# Patient Record
Sex: Female | Born: 2000 | Race: White | Hispanic: No | Marital: Married | Smoking: Current every day smoker
Health system: Southern US, Community
[De-identification: ages and names within clinical notes are randomized; demographics above are authoritative.]

## PROBLEM LIST (undated history)

## (undated) DIAGNOSIS — A048 Other specified bacterial intestinal infections: Secondary | ICD-10-CM

## (undated) DIAGNOSIS — G8929 Other chronic pain: Secondary | ICD-10-CM

## (undated) DIAGNOSIS — R5383 Other fatigue: Secondary | ICD-10-CM

## (undated) DIAGNOSIS — R109 Unspecified abdominal pain: Secondary | ICD-10-CM

## (undated) DIAGNOSIS — R112 Nausea with vomiting, unspecified: Secondary | ICD-10-CM

## (undated) DIAGNOSIS — Z87442 Personal history of urinary calculi: Secondary | ICD-10-CM

---
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
## 2018-01-29 ENCOUNTER — Ambulatory Visit: Payer: Self-pay | Admitting: Gastroenterology

## 2018-09-16 ENCOUNTER — Encounter: Payer: Self-pay | Admitting: Women's Health

## 2019-09-25 ENCOUNTER — Other Ambulatory Visit: Payer: Self-pay

## 2019-09-25 DIAGNOSIS — Z20822 Contact with and (suspected) exposure to covid-19: Secondary | ICD-10-CM

## 2019-09-26 LAB — NOVEL CORONAVIRUS, NAA: SARS-CoV-2, NAA: NOT DETECTED

## 2020-05-20 DIAGNOSIS — T7840XA Allergy, unspecified, initial encounter: Secondary | ICD-10-CM | POA: Insufficient documentation

## 2020-05-20 DIAGNOSIS — R0789 Other chest pain: Secondary | ICD-10-CM | POA: Insufficient documentation

## 2020-05-20 DIAGNOSIS — R0602 Shortness of breath: Secondary | ICD-10-CM | POA: Insufficient documentation

## 2020-05-20 DIAGNOSIS — R21 Rash and other nonspecific skin eruption: Secondary | ICD-10-CM | POA: Diagnosis not present

## 2020-05-20 DIAGNOSIS — L299 Pruritus, unspecified: Secondary | ICD-10-CM | POA: Insufficient documentation

## 2020-05-20 DIAGNOSIS — R0989 Other specified symptoms and signs involving the circulatory and respiratory systems: Secondary | ICD-10-CM | POA: Diagnosis present

## 2020-05-21 ENCOUNTER — Encounter (HOSPITAL_COMMUNITY): Payer: Self-pay

## 2020-05-21 ENCOUNTER — Other Ambulatory Visit: Payer: Self-pay

## 2020-05-21 ENCOUNTER — Emergency Department (HOSPITAL_COMMUNITY)
Admission: EM | Admit: 2020-05-21 | Discharge: 2020-05-21 | Disposition: A | Discharge status: Home / Self Care | Payer: Medicaid Other | Attending: Emergency Medicine | Admitting: Emergency Medicine

## 2020-05-21 DIAGNOSIS — T7840XA Allergy, unspecified, initial encounter: Secondary | ICD-10-CM

## 2020-05-21 LAB — TROPONIN I (HIGH SENSITIVITY)
Troponin I (High Sensitivity): <2 ng/L (ref <18)
Troponin I (High Sensitivity): <2 ng/L (ref <18)

## 2020-05-21 LAB — CBC WITH DIFFERENTIAL/PLATELET
Abs Immature Granulocytes: 0.02 10*3/uL (ref 0.00–0.07)
Basophils Absolute: 0 10*3/uL (ref 0.0–0.1)
Basophils Relative: 0 %
Eosinophils Absolute: 0.3 10*3/uL (ref 0.0–0.5)
Eosinophils Relative: 3 %
HCT: 43.7 % (ref 36.0–46.0)
Hemoglobin: 13.7 g/dL (ref 12.0–15.0)
Immature Granulocytes: 0 %
Lymphocytes Relative: 47 %
Lymphs Abs: 4.6 10*3/uL — ABNORMAL HIGH (ref 0.7–4.0)
MCH: 26.6 pg (ref 26.0–34.0)
MCHC: 31.4 g/dL (ref 30.0–36.0)
MCV: 84.9 fL (ref 80.0–100.0)
Monocytes Absolute: 0.6 10*3/uL (ref 0.1–1.0)
Monocytes Relative: 7 %
Neutro Abs: 4.1 10*3/uL (ref 1.7–7.7)
Neutrophils Relative %: 43 %
Platelets: 481 10*3/uL — ABNORMAL HIGH (ref 150–400)
RBC: 5.15 MIL/uL — ABNORMAL HIGH (ref 3.87–5.11)
RDW: 13.8 % (ref 11.5–15.5)
WBC: 9.7 10*3/uL (ref 4.0–10.5)
nRBC: 0 % (ref 0.0–0.2)

## 2020-05-21 LAB — BASIC METABOLIC PANEL
Anion gap: 11 (ref 5–15)
BUN: 13 mg/dL (ref 6–20)
CO2: 23 mmol/L (ref 22–32)
Calcium: 8.9 mg/dL (ref 8.9–10.3)
Chloride: 103 mmol/L (ref 98–111)
Creatinine, Ser: 0.89 mg/dL (ref 0.44–1.00)
GFR calc Af Amer: >60 mL/min (ref >60)
GFR calc non Af Amer: >60 mL/min (ref >60)
Glucose, Bld: 95 mg/dL (ref 70–99)
Potassium: 3.5 mmol/L (ref 3.5–5.1)
Sodium: 137 mmol/L (ref 135–145)

## 2020-05-21 MED ORDER — FAMOTIDINE IN NACL 20-0.9 MG/50ML-% IV SOLN
20.0000 mg | Freq: Once | INTRAVENOUS | Status: AC
  Administered 2020-05-21: 20 mg via INTRAVENOUS
  Filled 2020-05-21: qty 50

## 2020-05-21 MED ORDER — EPINEPHRINE 0.3 MG/0.3ML IJ SOAJ
0.3000 mg | Freq: Once | INTRAMUSCULAR | Status: AC
  Administered 2020-05-21: 0.3 mg via INTRAMUSCULAR
  Filled 2020-05-21: qty 0.3

## 2020-05-21 MED ORDER — PREDNISONE 50 MG PO TABS
ORAL_TABLET | ORAL | 0 refills | Status: DC
Start: 2020-05-21 — End: 2022-03-28

## 2020-05-21 MED ORDER — EPINEPHRINE 0.3 MG/0.3ML IJ SOAJ: 0.3000 mg | INTRAMUSCULAR | 0 refills | Status: AC | PRN

## 2020-05-21 MED ORDER — DIPHENHYDRAMINE HCL 50 MG/ML IJ SOLN
25.0000 mg | Freq: Once | INTRAMUSCULAR | Status: AC
  Administered 2020-05-21: 25 mg via INTRAVENOUS
  Filled 2020-05-21: qty 1

## 2020-05-21 MED ORDER — METHYLPREDNISOLONE SODIUM SUCC 125 MG IJ SOLR
125.0000 mg | Freq: Once | INTRAMUSCULAR | Status: AC
  Administered 2020-05-21: 125 mg via INTRAVENOUS
  Filled 2020-05-21: qty 2

## 2020-05-21 NOTE — ED Triage Notes (Signed)
Pt reports onset of itching, light whelts approx 30 mins ago.  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 Pt also reports mild thick feeling in her throat, no obvious oral swelling.

## 2020-05-21 NOTE — Discharge Instructions (Addendum)
Take the steroids and anithistamines as prescribed. Use the epipen as need for severe reaction with difficulty breathing, tongue or lip swelling, chest pain, shortness of breath or any other concerns. If you use the epipen, you must come to the hospital. Return to the ED if you develop new or worsening symptoms.

## 2020-05-21 NOTE — ED Provider Notes (Signed)
[facility] EMERGENCY DEPARTMENT Provider Note   CSN: [id number] Arrival date & time: 05/20/20  2358     History Chief Complaint  Patient presents with  . Allergic Reaction    Brenda Gilbert is a 19 y.o. female.  Patient here with suspected allergic reaction to unknown allergen.  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  She complains of throat tightness, itching all over and shortness of breath and chest tightness.  Took 2 Benadryl at home without relief.  Does not have an EpiPen.  Feels like in the back of her throat and tight in her chest with shortness of breath.  Denies any new exposures to foods, medications, lotions or clothing.  No seafood intake tonight.  No abdominal pain, nausea or vomiting no leg pain or leg swelling.  The history is provided by the patient.  Allergic Reaction Presenting symptoms: rash        Past Medical History:  Diagnosis Date  . Chronic abdominal pain   . Fatigue   . Helicobacter pylori (H. pylori) infection   . Nausea and vomiting    recurrent    There are no problems to display for this patient.   History reviewed. No pertinent surgical history.   OB History    Gravida      Para      Term      Preterm      AB      Living  0     SAB      TAB      Ectopic      Multiple      Live Births              Family History  Problem Relation Age of Onset  . Hypertension Mother   . Diabetes Father   . Hypertension Father     Social History   Tobacco Use  . Smoking status: Never Smoker  . Smokeless tobacco: Never Used  Substance Use Topics  . Alcohol use: No  . Drug use: No    Home Medications Prior to Admission medications   Medication Sig Start Date End Date Taking? Authorizing Provider  ondansetron (ZOFRAN ODT) 8 MG disintegrating tablet Take 1 tablet (8 mg total) by mouth every 8 (eight) hours as needed for nausea or vomiting. 11/12/16   Jola Schmidt, MD    Allergies     Fish-derived products  Review of Systems   Review of Systems  Constitutional: Negative for activity change, appetite change and fever.  HENT: Positive for sore throat. Negative for congestion and rhinorrhea.   Respiratory: Positive for chest tightness. Negative for shortness of breath.   Cardiovascular: Negative for chest pain.  Gastrointestinal: Negative for nausea and vomiting.  Genitourinary: Negative for dysuria and hematuria.  Musculoskeletal: Negative for arthralgias and myalgias.  Skin: Positive for rash.  Neurological: Negative for dizziness, weakness and headaches.   all other systems are negative except as noted in the HPI and PMH.    Physical Exam Updated Vital Signs BP (!) 133/94 (BP Location: Right Arm)   Pulse 98   Temp (!) 97.4 F (36.3 C) (Oral)   Resp 20   Ht 5\' 3"  (1.6 m)   Wt 78.9 kg   LMP 05/01/2020 Comment: had a baby April 4th  SpO2 100%   BMI 30.82 kg/m   Physical Exam Vitals and nursing note reviewed.  Constitutional:      General: She is  not in acute distress.    Appearance: She is well-developed. She is obese.  HENT:     Head: Normocephalic and atraumatic.     Mouth/Throat:     Pharynx: No oropharyngeal exudate.     Comments: Uvula appears bifurcated, baseline per patient. Uvular edema.  There is no swelling or asymmetry.  No tongue or lip swelling. Controlling secretions. Eyes:     Conjunctiva/sclera: Conjunctivae normal.     Pupils: Pupils are equal, round, and reactive to light.  Neck:     Comments: No meningismus. Cardiovascular:     Rate and Rhythm: Normal rate and regular rhythm.     Heart sounds: Normal heart sounds. No murmur.  Pulmonary:     Effort: Pulmonary effort is normal. No respiratory distress.     Breath sounds: Normal breath sounds. No wheezing.  Abdominal:     Palpations: Abdomen is soft.     Tenderness: There is no abdominal tenderness. There is no guarding or rebound.  Musculoskeletal:        General: No  tenderness. Normal range of motion.     Cervical back: Normal range of motion and neck supple.  Skin:    General: Skin is warm.     Findings: Rash present.     Comments: Scattered urticaria to arms and legs  Neurological:     Mental Status: She is alert and oriented to person, place, and time.     Cranial Nerves: No cranial nerve deficit.     Motor: No abnormal muscle tone.     Coordination: Coordination normal.     Comments:  5/5 strength throughout. CN 2-12 intact.Equal grip strength.   Psychiatric:        Behavior: Behavior normal.     ED Results / Procedures / Treatments   Labs (all labs ordered are listed, but only abnormal results are displayed) Labs Reviewed  CBC WITH DIFFERENTIAL/PLATELET - Abnormal; Notable for the following components:      Result Value   RBC 5.15 (*)    Platelets 481 (*)    Lymphs Abs 4.6 (*)    All other components within normal limits  BASIC METABOLIC PANEL  TROPONIN I (HIGH SENSITIVITY)  TROPONIN I (HIGH SENSITIVITY)    EKG EKG Interpretation  Date/Time:  Friday May 21 2020 00:44:49 EDT Ventricular Rate:  76 PR Interval:    QRS Duration: 94 QT Interval:  377 QTC Calculation: 424 R Axis:   91 Text Interpretation: Sinus rhythm Borderline right axis deviation Low voltage, precordial leads No significant change was found Confirmed by Glynn Octave [id number]) on 05/21/2020 1:16:27 AM   Radiology No results found.  Procedures .Critical Care Performed by: Glynn Octave, MD Authorized by: Glynn Octave, MD   Critical care provider statement:    Critical care time (minutes):  45   Critical care was necessary to treat or prevent imminent or life-threatening deterioration of the following conditions: anaphylaxis.   Critical care was time spent personally by me on the following activities:  Discussions with consultants, evaluation of patient's response to treatment, examination of patient, ordering and performing treatments and  interventions, ordering and review of laboratory studies, ordering and review of radiographic studies, pulse oximetry, re-evaluation of patient's condition, obtaining history from patient or surrogate and review of old charts   (including critical care time)  Medications Ordered in ED Medications  EPINEPHrine (EPI-PEN) injection 0.3 mg (has no administration in time range)  methylPREDNISolone sodium succinate (SOLU-MEDROL) 125 mg/2 mL injection 125 mg (  has no administration in time range)  famotidine (PEPCID) IVPB 20 mg premix (has no administration in time range)  diphenhydrAMINE (BENADRYL) injection 25 mg (has no administration in time range)    ED Course  I have reviewed the triage vital signs and the nursing notes.  Pertinent labs & imaging results that were available during my care of the patient were reviewed by me and considered in my medical decision making (see chart for details).    MDM Rules/Calculators/A&P                     Allergic reaction to unknown allergen.  No tongue or lip swelling.  Does complain of chest tightness and throat tightness and shortness of breath.  She is given IM epinephrine, steroids and Benadryl.  Patient improved with IM epinephrine, steroids and antihistamines.  Observed in the ED for 3 hours after epinephrine injection without deterioration.  She feels back to baseline with no chest pain, shortness of breath, tongue or lip swelling.  Feels normal.  Her rash has resolved.  Labs reassuring.  Troponin negative.  Low suspicion for ACS or PE.  EKG sinus rhythm.  Unclear etiology of her reaction.  Will give EpiPen for home.  She is not breast-feeding.  Will give short course of steroids and antihistamines.  Indications for EpiPen discussed with patient and her significant other.  Return precautions discussed including chest pain, difficulty breathing, tongue or lip swelling, any other concerns. Final Clinical Impression(s) / ED Diagnoses Final  diagnoses:  Allergic reaction, initial encounter    Rx / DC Orders ED Discharge Orders    None       Agnieszka Newhouse, Jeannett Senior, MD 05/21/20 [phone]

## 2020-08-16 ENCOUNTER — Ambulatory Visit
Admission: EM | Admit: 2020-08-16 | Discharge: 2020-08-16 | Disposition: A | Discharge status: Home / Self Care | Payer: Medicaid Other | Attending: Emergency Medicine | Admitting: Emergency Medicine

## 2020-08-16 ENCOUNTER — Encounter (HOSPITAL_COMMUNITY): Payer: Self-pay

## 2020-08-16 ENCOUNTER — Emergency Department (HOSPITAL_COMMUNITY)
Admission: EM | Admit: 2020-08-16 | Discharge: 2020-08-16 | Disposition: A | Discharge status: Home / Self Care | Payer: Medicaid Other | Attending: Emergency Medicine | Admitting: Emergency Medicine

## 2020-08-16 ENCOUNTER — Other Ambulatory Visit: Payer: Self-pay

## 2020-08-16 DIAGNOSIS — N390 Urinary tract infection, site not specified: Secondary | ICD-10-CM | POA: Diagnosis not present

## 2020-08-16 DIAGNOSIS — R350 Frequency of micturition: Secondary | ICD-10-CM

## 2020-08-16 DIAGNOSIS — Z5321 Procedure and treatment not carried out due to patient leaving prior to being seen by health care provider: Secondary | ICD-10-CM | POA: Insufficient documentation

## 2020-08-16 DIAGNOSIS — R109 Unspecified abdominal pain: Secondary | ICD-10-CM | POA: Diagnosis present

## 2020-08-16 DIAGNOSIS — R319 Hematuria, unspecified: Secondary | ICD-10-CM | POA: Diagnosis not present

## 2020-08-16 LAB — COMPREHENSIVE METABOLIC PANEL WITH GFR
ALT: 21 U/L (ref 0–44)
AST: 15 U/L (ref 15–41)
Albumin: 4 g/dL (ref 3.5–5.0)
Alkaline Phosphatase: 54 U/L (ref 38–126)
Anion gap: 8 (ref 5–15)
BUN: 11 mg/dL (ref 6–20)
CO2: 23 mmol/L (ref 22–32)
Calcium: 9.1 mg/dL (ref 8.9–10.3)
Chloride: 105 mmol/L (ref 98–111)
Creatinine, Ser: 0.83 mg/dL (ref 0.44–1.00)
GFR calc Af Amer: >60 mL/min
GFR calc non Af Amer: >60 mL/min
Glucose, Bld: 112 mg/dL — ABNORMAL HIGH (ref 70–99)
Potassium: 3.7 mmol/L (ref 3.5–5.1)
Sodium: 136 mmol/L (ref 135–145)
Total Bilirubin: 0.4 mg/dL (ref 0.3–1.2)
Total Protein: 7.5 g/dL (ref 6.5–8.1)

## 2020-08-16 LAB — CBC
HCT: 40.3 % (ref 36.0–46.0)
Hemoglobin: 13.2 g/dL (ref 12.0–15.0)
MCH: 27.3 pg (ref 26.0–34.0)
MCHC: 32.8 g/dL (ref 30.0–36.0)
MCV: 83.3 fL (ref 80.0–100.0)
Platelets: 434 10*3/uL — ABNORMAL HIGH (ref 150–400)
RBC: 4.84 MIL/uL (ref 3.87–5.11)
RDW: 13 % (ref 11.5–15.5)
WBC: 10.7 10*3/uL — ABNORMAL HIGH (ref 4.0–10.5)
nRBC: 0 % (ref 0.0–0.2)

## 2020-08-16 LAB — POCT URINALYSIS DIP (MANUAL ENTRY)
Bilirubin, UA: NEGATIVE
Glucose, UA: NEGATIVE mg/dL
Nitrite, UA: NEGATIVE
Spec Grav, UA: 1.025
Urobilinogen, UA: 1 U/dL
pH, UA: 6.5

## 2020-08-16 LAB — LIPASE, BLOOD: Lipase: 31 U/L (ref 11–51)

## 2020-08-16 LAB — POCT URINE PREGNANCY: Preg Test, Ur: NEGATIVE

## 2020-08-16 NOTE — ED Triage Notes (Signed)
Pt to er, pt states that she just got a puppy and they puppy had worms, states that since then she has been having some abd pain.  States that she started having some blood in her urine, states that she thought that she might have a uti, states that then she had a bm and there were worms in her stool.  Denies  Blood in her stool

## 2020-08-16 NOTE — Discharge Instructions (Addendum)
Urine culture sent.  We will call you with the results.   Push fluids and get plenty of rest.   Follow up with PCP to have GI lab completed Return here or go to ER if you have any new or worsening symptoms such as fever, worsening abdominal pain, nausea/vomiting, flank pain, etc..Marland Kitchen

## 2020-08-16 NOTE — ED Provider Notes (Addendum)
[facility] ([facility])  Chief Complaint  Patient presents with  . Urinary Tract Infection      SUBJECTIVE:  Brenda Gilbert is a 19 y.o. female who complains of frequency, mucoid vaginal  discharge that occurred today.  Patient denies a precipitating event, recent sexual encounter, excessive caffeine intake.  Denies abdominal or flank pain.  Has tried OTC medications without relief.  Symptoms are made worse with urination.  Admits to similar symptoms in the past.  Denies fever, chills, nausea, vomiting, abdominal pain, flank pain, abnormal vaginal discharge or bleeding, hematuria.    She is also complaining of intestinal worms.  Report a bowel movement with the large worm day.  Has a dog with her worm as well.  Has not  use any medication.  Denies similar symptoms in the past.  LMP: No LMP recorded.  ROS: As in HPI.  All other pertinent ROS negative.     Past Medical History:  Diagnosis Date  . Chronic abdominal pain   . Fatigue   . Helicobacter pylori (H. pylori) infection   . Nausea and vomiting    recurrent   Past Surgical History:  Procedure Laterality Date  . CESAREAN SECTION     Allergies  Allergen Reactions  . Fish-Derived Products Anaphylaxis    seafood   No current facility-administered medications on file prior to encounter.   Current Outpatient Medications on File Prior to Encounter  Medication Sig Dispense Refill  . EPINEPHrine (EPIPEN 2-PAK) 0.3 mg/0.3 mL IJ SOAJ injection Inject 0.3 mLs (0.3 mg total) into the muscle as needed for anaphylaxis (for severe allergic reaction with difficulty breathing, tongue or lip swelling, chest pain or shortness of breath). 1 each 0  . ondansetron (ZOFRAN ODT) 8 MG disintegrating tablet Take 1 tablet (8 mg total) by mouth every 8 (eight) hours as needed for nausea or vomiting. 10 tablet 0  . predniSONE (DELTASONE) 50 MG tablet 1 tablet PO daily 5 tablet 0   Social History   Socioeconomic History  . Marital status: Single     Spouse name: Not on file  . Number of children: Not on file  . Years of education: Not on file  . Highest education level: Not on file  Occupational History  . Not on file  Tobacco Use  . Smoking status: Never Smoker  . Smokeless tobacco: Never Used  Substance and Sexual Activity  . Alcohol use: No  . Drug use: No  . Sexual activity: Never  Other Topics Concern  . Not on file  Social History Narrative  . Not on file   Social Determinants of Health   Financial Resource Strain:   . Difficulty of Paying Living Expenses: Not on file  Food Insecurity:   . Worried About Programme researcher, broadcasting/film/video in the Last Year: Not on file  . Ran Out of Food in the Last Year: Not on file  Transportation Needs:   . Lack of Transportation (Medical): Not on file  . Lack of Transportation (Non-Medical): Not on file  Physical Activity:   . Days of Exercise per Week: Not on file  . Minutes of Exercise per Session: Not on file  Stress:   . Feeling of Stress : Not on file  Social Connections:   . Frequency of Communication with Friends and Family: Not on file  . Frequency of Social Gatherings with Friends and Family: Not on file  . Attends Religious Services: Not on file  . Active Member of Clubs or Organizations:  Not on file  . Attends Banker Meetings: Not on file  . Marital Status: Not on file  Intimate Partner Violence:   . Fear of Current or Ex-Partner: Not on file  . Emotionally Abused: Not on file  . Physically Abused: Not on file  . Sexually Abused: Not on file   Family History  Problem Relation Age of Onset  . Hypertension Mother   . Diabetes Father   . Hypertension Father     OBJECTIVE:  Vitals:   08/16/20 1710  BP: 127/84  Pulse: 90  Resp: 16  Temp: 98.9 F (37.2 C)  TempSrc: Oral  SpO2: 97%   General appearance: AOx3 in no acute distress HEENT: NCAT.  Oropharynx clear.  Lungs: clear to auscultation bilaterally without adventitious breath sounds Heart: regular  rate and rhythm.  Radial pulses 2+ symmetrical bilaterally Abdomen: soft; non-distended; no tenderness; bowel sounds present; no guarding or rebound tenderness Back: no CVA tenderness Extremities: no edema; symmetrical with no gross deformities Skin: warm and dry Neurologic: Ambulates from chair to exam table without difficulty Psychological: alert and cooperative; normal mood and affect  Labs Reviewed  POCT URINALYSIS DIP (MANUAL ENTRY) - Abnormal; Notable for the following components:      Result Value   Ketones, POC UA trace (5) (*)    Blood, UA small (*)    Protein Ur, POC trace (*)    Leukocytes, UA Trace (*)    All other components within normal limits  URINE CULTURE  POCT URINE PREGNANCY    ASSESSMENT & PLAN:  1. Urine frequency     No orders of the defined types were placed in this encounter.  Discharge instructions Urine culture sent.  We will call you with the results.   Push fluids and get plenty of rest.   Follow up with PCP to have a GI lab completed Return here or go to ER if you have any new or worsening symptoms such as fever, worsening abdominal pain, nausea/vomiting, flank pain, etc...  Outlined signs and symptoms indicating need for more acute intervention. Patient verbalized understanding. After Visit Summary given.   Note: This document was prepared using Dragon voice recognition software and may include unintentional dictation errors.    Durward Parcel, FNP 08/16/20 1758    Durward Parcel, FNP 08/16/20 1758

## 2020-08-16 NOTE — ED Triage Notes (Signed)
See provider note. 

## 2020-08-18 LAB — URINE CULTURE: Culture: 50000 — AB

## 2020-08-19 ENCOUNTER — Telehealth (HOSPITAL_COMMUNITY): Payer: Self-pay | Admitting: Emergency Medicine

## 2020-08-19 MED ORDER — CEPHALEXIN 500 MG PO CAPS
500.0000 mg | ORAL_CAPSULE | Freq: Two times a day (BID) | ORAL | 0 refills | Status: AC
Start: 2020-08-19 — End: 2020-08-26

## 2020-08-19 NOTE — Telephone Encounter (Signed)
Positive urine culture.  No abx prescribed at the time of the visit.  Keflex 500mg  BID x 7 days sent to pharmacy of request.  Patient informed.

## 2021-03-23 ENCOUNTER — Inpatient Hospital Stay (HOSPITAL_COMMUNITY)
Admission: AD | Admit: 2021-03-23 | Discharge: 2021-03-23 | Disposition: A | Discharge status: Home / Self Care | Payer: Medicaid Other | Attending: Obstetrics and Gynecology | Admitting: Obstetrics and Gynecology

## 2021-03-23 ENCOUNTER — Other Ambulatory Visit: Payer: Self-pay

## 2021-03-23 DIAGNOSIS — N939 Abnormal uterine and vaginal bleeding, unspecified: Secondary | ICD-10-CM | POA: Insufficient documentation

## 2021-03-23 DIAGNOSIS — N926 Irregular menstruation, unspecified: Secondary | ICD-10-CM

## 2021-03-23 LAB — POCT PREGNANCY, URINE: Preg Test, Ur: NEGATIVE

## 2021-03-23 MED ORDER — IBUPROFEN 600 MG PO TABS: 600.0000 mg | ORAL_TABLET | Freq: Four times a day (QID) | ORAL | 1 refills | Status: DC | PRN

## 2021-03-23 NOTE — MAU Note (Signed)
Wynelle Bourgeois CNM in [place] [facility] to see pt and discuss plan of care.

## 2021-03-23 NOTE — MAU Note (Signed)
My period started this am and my bleeding has been very heavy all day. Having a lot of cramping in my back and abdomen. Cannot get into my ob/gyn until Tues but was told to come here if pain got worse. Has not taken anything for pain

## 2021-03-23 NOTE — Discharge Instructions (Signed)
Menstruation  Menstruation, also known as a menstrual period, is the monthly shedding of the lining of the uterus. The lining of the uterus is made up of blood, tissue, fluid, and mucus. The uterus is the organ in the lower abdomen where a baby grows during pregnancy. The flow of blood usually occurs during 3-7 consecutive days each month. Girls usually start their periods between the ages of 12 and 14, but some girls may be older or younger when they start their periods. Women continue to have periods until they reach menopause. Menopause usually occurs between the ages of 48 and 55. A period is part of a woman's menstrual cycle, which is a series of changes that the body goes through to prepare for pregnancy. Usually, you will get your period about every 28 days if you do not get pregnant. However, some women get their periods as soon as every 21 days or as late as every 45 days. Hormones control the menstrual cycle. Hormones are chemicals that the body produces to regulate different body functions. These hormones trigger changes in your uterus. Every month, the lining of your uterus gets thicker to prepare for pregnancy. And every month that you do not get pregnant, your uterus gets rid of its thick lining and cleans itself out. This is your period. What can I expect during my period? Periods are different for each woman and girl. You may experience:  Bleeding that lasts for 3-7 days. A little more or less bleeding is normal.  Occasional heavy bleeding.  Cramps in the lower abdomen.  Aching or pain in the lower back area.  Sore breasts.  Dizziness.  Nausea or diarrhea. Other symptoms may occur 1 to 2 weeks before your menstrual period starts and go away a few days after menstrual bleeding begins. These symptoms are referred to as premenstrual syndrome (PMS). These symptoms can include:  Headache.  Breast tenderness and swelling.  Bloating.  Tiredness (fatigue).  Mood  changes.  Craving for certain foods.  Trouble concentrating. Supplies needed:  Tampons, sanitary pads, or menstrual cups.  Over-the-counter pain reliever as told by your health care provider.  Heating pad or wrap. How to care for yourself during your period You can capture the flow of menstrual blood using:  Tampons. These are pieces of cotton that are usually packed into plastic or cardboard applicators. The cotton has a string attached to it. You insert the cotton inside your vagina to absorb your menstrual blood and pull the string to remove it. You must change your tampon at least every 4-8 hours.  Sanitary pads. These are thick pads with a sticky back that you attach to your underwear to absorb menstrual blood. You must change your pad at least every 4-8 hours, or whenever you are uncomfortable.  Menstrual cups. These are small rubber cups that you place inside of your vagina. You must remove and empty a menstrual cup at least every 8-12 hours. To help relieve pain and discomfort during your period:  Take an over-the-counter pain reliever as told by your health care provider.  Use a heating pad or heat wrap on your abdomen to ease cramping.  Exercise regularly.  Eat a healthy diet. A healthy diet includes a lot of fruits and vegetables, low-fat dairy products, lean meats, and foods that contain fiber.  Avoid foods and drinks that may make your symptoms worse before or during your period. This includes foods that contain: ? Caffeine. ? Salt. ? Sugar.   How do   I know if my period is not normal? Periods are different for everyone. Your period may last for a longer or shorter time than usual, and bleeding may be light or heavy. Signs that your period may not be normal include:  Bleeding very heavily, such as soaking through a tampon or pad in 1-2 hours.  Bleeding more than 7 days.  Bleeding after you have sex.  Cramps that are so painful you cannot do your daily  activities.  Cramps that get much worse than they used to be.  Bleeding between periods.  Missing your period for longer than 3 months.  Your menstrual cycle becoming irregular, when it used to be regular. Follow these instructions at home:  Keep track of your periods by using a calendar.  If you use tampons, use the least absorbent possible to avoid complications such as toxic shock syndrome.  Do not leave tampons in your vagina overnight or longer than 8 hours.  Wear a sanitary pad overnight. Contact a health care provider if:  You have signs that your period may not be normal.  You develop a fever with your period.  Your periods last more than 7 days.  You develop clots with your period and never had clots before.  You cannot get relief for your symptoms from over-the-counter medicine. Get help right away if:  Your period is so heavy that you have to change pads or tampons every 30 minutes.  You have any symptoms of toxic shock syndrome (TSS), such as: ? A high fever. ? Vomiting or diarrhea. ? Red skin that looks like a sunburn. ? Red eyes. ? Fainting or feeling dizzy. ? Sore throat. ? Muscle aches. If you develop any of these symptoms, visit your health care provider immediately. TSS is a serious health condition that can be caused by wearing a tampon for too long. Summary  Menstruation, also known as a menstrual period, is the monthly shedding of the lining of the uterus.  During your period, you pass blood, tissue, fluid, and mucus out of your vagina.  Keep track of your periods by using a calendar.  Contact a health care provider if you have signs that your period may not be normal. This information is not intended to replace advice given to you by your health care provider. Make sure you discuss any questions you have with your health care provider. Document Revised: 07/28/2020 Document Reviewed: 07/28/2020 Elsevier Patient Education  2021 Elsevier  Inc.  

## 2021-03-23 NOTE — MAU Provider Note (Signed)
MSE done at 2245   S Ms. Brenda Gilbert is a 20 y.o. . patient who presents to MAU today with complaint of heavy bleeding and cramping with menses.  Started today. Has not taken anything for it.  Has an appt in [place] on Tuesday for evaluation .  RN Note: My period started this am and my bleeding has been very heavy all day. Having a lot of cramping in my back and abdomen. Cannot get into my ob/gyn until Tues but was told to come here if pain got worse. Has not taken anything for pain  O BP 127/82 (BP Location: Right Arm)   Pulse (!) 111   Temp 98.5 F (36.9 C)   Resp 18   Ht 5\' 3"  (1.6 m)   Wt 78.5 kg   LMP 03/23/2021   SpO2 100%   BMI 30.65 kg/m  Physical Exam Constitutional:      General: She is not in acute distress.    Appearance: She is well-developed. She is not ill-appearing or toxic-appearing.  Cardiovascular:     Rate and Rhythm: Normal rate.  Pulmonary:     Effort: Pulmonary effort is normal.  Skin:    General: Skin is dry.  Neurological:     Mental Status: She is alert.     A Medical screening exam complete Menstrual complications  P Discharge from MAU in stable condition Patient given the option of transfer to [facility] for further evaluation or seek care in outpatient facility of choice (reviewed [facility] location if wants [facility] group)  Warning signs for worsening condition that would warrant emergency follow-up discussed Patient may return to MAU as needed if becomes pregnant  [facility], [facility] Signs 03/24/2021 5:24 AM

## 2022-03-16 LAB — TSH: TSH: 0.36 — AB (ref 0.41–5.90)

## 2022-03-16 LAB — BASIC METABOLIC PANEL
BUN: 10 (ref 4–21)
Creatinine: 0.8 (ref 0.5–1.1)

## 2022-03-16 LAB — COMPREHENSIVE METABOLIC PANEL
Calcium: 10 (ref 8.7–10.7)
eGFR: 107

## 2022-03-16 LAB — LIPID PANEL
LDL Cholesterol: 148
Triglycerides: 94 (ref 40–160)

## 2022-03-20 ENCOUNTER — Encounter: Payer: Self-pay | Admitting: Internal Medicine

## 2022-03-28 ENCOUNTER — Encounter: Payer: Self-pay | Admitting: Nurse Practitioner

## 2022-03-28 ENCOUNTER — Ambulatory Visit (INDEPENDENT_AMBULATORY_CARE_PROVIDER_SITE_OTHER): Payer: Medicaid Other | Admitting: Nurse Practitioner

## 2022-03-28 VITALS — BP 104/68 | HR 83 | Ht 63.1 in | Wt 200.4 lb

## 2022-03-28 DIAGNOSIS — R7989 Other specified abnormal findings of blood chemistry: Secondary | ICD-10-CM | POA: Diagnosis not present

## 2022-03-28 DIAGNOSIS — E0590 Thyrotoxicosis, unspecified without thyrotoxic crisis or storm: Secondary | ICD-10-CM | POA: Diagnosis not present

## 2022-03-28 NOTE — Progress Notes (Signed)
? ? ? 03/28/2022   ? ? ?Endocrinology Consult Note  ? ? ?Subjective:  ? ? Patient ID: Brenda SorensonLaycie Gilbert, female    DOB: 06/21/2001, PCP Brenda NevinsFusco, Lawrence, MD. ? ? ?Past Medical History:  ?Diagnosis Date  ? Chronic abdominal pain   ? Fatigue   ? Helicobacter pylori (H. pylori) infection   ? Nausea and vomiting   ? recurrent  ? ? ?Past Surgical History:  ?Procedure Laterality Date  ? CESAREAN SECTION    ? 03/28/2020 and 12/30/2021  ? ? ?Social History  ? ?Socioeconomic History  ? Marital status: Married  ?  Spouse name: Not on file  ? Number of children: Not on file  ? Years of education: Not on file  ? Highest education level: Not on file  ?Occupational History  ? Not on file  ?Tobacco Use  ? Smoking status: Every Day  ?  Types: Cigarettes  ? Smokeless tobacco: Never  ?Vaping Use  ? Vaping Use: Every day  ?Substance and Sexual Activity  ? Alcohol use: No  ? Drug use: No  ? Sexual activity: Yes  ?Other Topics Concern  ? Not on file  ?Social History Narrative  ? Not on file  ? ?Social Determinants of Health  ? ?Financial Resource Strain: Not on file  ?Food Insecurity: Not on file  ?Transportation Needs: Not on file  ?Physical Activity: Not on file  ?Stress: Not on file  ?Social Connections: Not on file  ? ? ?Family History  ?Problem Relation Age of Onset  ? Cancer Mother   ? Thyroid disease Mother   ? Hypertension Mother   ? Cancer Father   ? Diabetes Father   ? Hypertension Father   ? ? ?Outpatient Encounter Medications as of 03/28/2022  ?Medication Sig  ? citalopram (CELEXA) 20 MG tablet Take 40 mg by mouth daily.  ? EPINEPHrine (EPIPEN 2-PAK) 0.3 mg/0.3 mL IJ SOAJ injection Inject 0.3 mLs (0.3 mg total) into the muscle as needed for anaphylaxis (for severe allergic reaction with difficulty breathing, tongue or lip swelling, chest pain or shortness of breath).  ? pantoprazole (PROTONIX) 40 MG tablet Take 40 mg by mouth 2 (two) times daily.  ? SPRINTEC 28 0.25-35 MG-MCG tablet Take 1 tablet by mouth daily.  ? ibuprofen  (ADVIL) 600 MG tablet Take 1 tablet (600 mg total) by mouth every 6 (six) hours as needed. (Patient not taking: Reported on 03/28/2022)  ? ondansetron (ZOFRAN ODT) 8 MG disintegrating tablet Take 1 tablet (8 mg total) by mouth every 8 (eight) hours as needed for nausea or vomiting. (Patient not taking: Reported on 03/28/2022)  ? predniSONE (DELTASONE) 50 MG tablet 1 tablet PO daily (Patient not taking: Reported on 03/28/2022)  ? ?No facility-administered encounter medications on file as of 03/28/2022.  ? ? ?ALLERGIES: ?Allergies  ?Allergen Reactions  ? Fish-Derived Products Anaphylaxis  ?  seafood  ? ? ?VACCINATION STATUS: ? ?There is no immunization history on file for this patient. ? ? ?HPI ? ?Brenda Gilbert is 21 y.o. female who presents today with a medical history as above. she is being seen in consultation for hyperthyroidism requested by Brenda NevinsFusco, Lawrence, MD.  she has been dealing with symptoms of  anxiety, insomnia, and cold/hot temperature fluctuations for 2 years approximately. These symptoms are progressively worsening and troubling to her.  her most recent thyroid labs revealed mildly suppressed TSH of 0.363 on 03/16/22.  She says her OBGYN was monitoring her thyroid function during this last pregnancy as TSH  was slightly low but she does not know of any other incidences where her thyroid levels have been abnormal.  ? ?she denies dysphagia, choking, shortness of breath, no recent voice change.  ?  ?she does have extensive family history of thyroid dysfunction in her mother (hypothyroidism), grandmother (hyperactive) and aunts, but denies family hx of thyroid cancer. she denies personal history of goiter. she is not on any anti-thyroid medications nor on any thyroid hormone supplements. She has been on hair skin and nail vitamins in the past but has not been taking them in quite some time.  Denies use of Biotin containing supplements.  she is willing to proceed with appropriate work up and therapy for  thyrotoxicosis. ? ? ?Review of systems ? ?Constitutional: + increasing body weight-recently had her second child, current Body mass index is 35.39 kg/m?., no fatigue, + hot/cold temperature fluctuations, she is 3 months post-partum ?Eyes: no blurry vision, no xerophthalmia ?ENT: no sore throat, no nodules palpated in throat, no dysphagia/odynophagia, no hoarseness ?Cardiovascular: no chest pain, no shortness of breath, no palpitations, no leg swelling ?Respiratory: no cough, no shortness of breath ?Gastrointestinal: no nausea/vomiting/diarrhea ?Musculoskeletal: no muscle/joint aches ?Skin: no rashes, no hyperemia ?Neurological: no tremors, no numbness, no tingling, no dizziness ?Psychiatric: no depression, + anxiety, + insomnia (has history of PTSD) ? ? ?Objective:  ?  ?BP 104/68   Pulse 83   Ht 5' 3.1" (1.603 m)   Wt 200 lb 6.4 oz (90.9 kg)   SpO2 97%   BMI 35.39 kg/m?   ?Wt Readings from Last 3 Encounters:  ?03/28/22 200 lb 6.4 oz (90.9 kg)  ?03/23/21 173 lb (78.5 kg)  ?05/21/20 174 lb (78.9 kg)  ?  ? ?BP Readings from Last 3 Encounters:  ?03/28/22 104/68  ?03/23/21 127/82  ?08/16/20 127/84  ? ? ?                     ? ?Physical Exam- Limited ? ?Constitutional:  Body mass index is 35.39 kg/m?. , not in acute distress, normal state of mind ?Eyes:  EOMI, no exophthalmos ?Neck: Supple ?Thyroid: Mild fullness noted bilaterally- tender to palpation on right side ?Cardiovascular: RRR, no murmurs, rubs, or gallops, no edema ?Respiratory: Adequate breathing efforts, no crackles, rales, rhonchi, or wheezing ?Musculoskeletal: no gross deformities, strength intact in all four extremities, no gross restriction of joint movements ?Skin:  no rashes, no hyperemia ?Neurological: + tremor with outstretched hands, DTR normal in BLE ? ? ?CMP  ?   ?Component Value Date/Time  ? NA 136 08/16/2020 1552  ? K 3.7 08/16/2020 1552  ? CL 105 08/16/2020 1552  ? CO2 23 08/16/2020 1552  ? GLUCOSE 112 (H) 08/16/2020 1552  ? BUN 10  03/16/2022 0000  ? CREATININE 0.8 03/16/2022 0000  ? CREATININE 0.83 08/16/2020 1552  ? CALCIUM 10.0 03/16/2022 0000  ? PROT 7.5 08/16/2020 1552  ? ALBUMIN 4.0 08/16/2020 1552  ? AST 15 08/16/2020 1552  ? ALT 21 08/16/2020 1552  ? ALKPHOS 54 08/16/2020 1552  ? BILITOT 0.4 08/16/2020 1552  ? GFRNONAA >60 08/16/2020 1552  ? GFRAA >60 08/16/2020 1552  ? ? ? ?CBC ?   ?Component Value Date/Time  ? WBC 10.7 (H) 08/16/2020 1552  ? RBC 4.84 08/16/2020 1552  ? HGB 13.2 08/16/2020 1552  ? HCT 40.3 08/16/2020 1552  ? PLT 434 (H) 08/16/2020 1552  ? MCV 83.3 08/16/2020 1552  ? MCH 27.3 08/16/2020 1552  ? MCHC 32.8 08/16/2020 1552  ?  RDW 13.0 08/16/2020 1552  ? LYMPHSABS 4.6 (H) 05/21/2020 0025  ? MONOABS 0.6 05/21/2020 0025  ? EOSABS 0.3 05/21/2020 0025  ? BASOSABS 0.0 05/21/2020 0025  ? ? ? ?Diabetic Labs (most recent): ?No results found for: HGBA1C ? ?Lipid Panel  ?   ?Component Value Date/Time  ? TRIG 94 03/16/2022 0000  ? LDLCALC 148 03/16/2022 0000  ? ? ? ?Lab Results  ?Component Value Date  ? TSH 0.36 (A) 03/16/2022  ? TSH 0.671 06/10/2016  ?  ? ? ? ? ?Assessment & Plan:  ? ?1. Abnormal TSH- ? Hyperthyroidism ? ?she is being seen at a kind request of Brenda Nevins, MD. ? ?her history and most recent labs are reviewed, and she was examined clinically. Subjective and objective findings are inconsistent with thyrotoxicosis from primary hyperthyroidism. The potential risks of untreated thyrotoxicosis and the need for definitive therapy have been discussed in detail with her, and she agrees to proceed with diagnostic workup and treatment plan. ?  ?I will repeat full profile thyroid function tests today, including antibody testing to assess for autoimmune thyroid dysfunction.  Depending on her results, will recommend follow up imaging.  If labs still suggestive of hyperthyroidism, will order uptake and scan, otherwise, will order thyroid ultrasound to assess baseline anatomy given mild goiter.  I will call her with the results  once they return and discuss further imaging. ?  ?Options of therapy are discussed with her if she is confirmed to have truly hyperactive thyroid.  We discussed the option of treating it with medications including methimazole or P

## 2022-03-28 NOTE — Patient Instructions (Signed)
Hyperthyroidism  Hyperthyroidism is when the thyroid gland is too active (overactive). The thyroid gland is a small gland located in the lower front part of the neck, just in front of the windpipe (trachea). This gland makes hormones that help control how the body uses food for energy (metabolism) as well as how the heart and brain function. These hormones also play a role in keeping your bones strong. When the thyroid is overactive, it produces toomuch of a hormone called thyroxine. What are the causes? This condition may be caused by: Graves' disease. This is a disorder in which the body's disease-fighting system (immune system) attacks the thyroid gland. This is the most common cause. Inflammation of the thyroid gland. A tumor in the thyroid gland. Use of certain medicines, including: Prescription thyroid hormone replacement. Herbal supplements that mimic thyroid hormones. Amiodarone therapy. Solid or fluid-filled lumps within your thyroid gland (thyroid nodules). Taking in a large amount of iodine from foods or medicines. What increases the risk? You are more likely to develop this condition if: You are female. You have a family history of thyroid conditions. You smoke tobacco. You use a medicine called lithium. You take medicines that affect the immune system (immunosuppressants). What are the signs or symptoms? Symptoms of this condition include: Nervousness. Inability to tolerate heat. Unexplained weight loss. Diarrhea. Change in the texture of hair or skin. Heart skipping beats or making extra beats. Rapid heart rate. Loss of menstruation. Shaky hands. Fatigue. Restlessness. Sleep problems. Enlarged thyroid gland or a lump in the thyroid (nodule). You may also have symptoms of Graves' disease, which may include: Protruding eyes. Dry eyes. Red or swollen eyes. Problems with vision. How is this diagnosed? This condition may be diagnosed based on: Your symptoms and  medical history. A physical exam. Blood tests. Thyroid ultrasound. This test involves using sound waves to produce images of the thyroid gland. A thyroid scan. A radioactive substance is injected into a vein, and images show how much iodine is present in the thyroid. Radioactive iodine uptake test (RAIU). A small amount of radioactive iodine is given by mouth to see how much iodine the thyroid absorbs after a certain amount of time. How is this treated? Treatment depends on the cause and severity of the condition. Treatment may include: Medicines to reduce the amount of thyroid hormone your body makes. Radioactive iodine treatment (radioiodine therapy). This involves swallowing a small dose of radioactive iodine, in capsule or liquid form, to kill thyroid cells. Surgery to remove part or all of your thyroid gland. You may need to take thyroid hormone replacement medicine for the rest of your life after thyroid surgery. Medicines to help manage your symptoms. Follow these instructions at home:  Take over-the-counter and prescription medicines only as told by your health care provider. Do not use any products that contain nicotine or tobacco, such as cigarettes and e-cigarettes. If you need help quitting, ask your health care provider. Follow any instructions from your health care provider about diet. You may be instructed to limit foods that contain iodine. Keep all follow-up visits as told by your health care provider. This is important. You will need to have blood tests regularly so that your health care provider can monitor your condition. Contact a health care provider if: Your symptoms do not get better with treatment. You have a fever. You are taking thyroid hormone replacement medicine and you: Have symptoms of depression. Feel like you are tired all the time. Gain weight. Get help right   away if: You have chest pain. You have decreased alertness or a change in your awareness. You  have abdominal pain. You feel dizzy. You have a rapid heartbeat. You have an irregular heartbeat. You have difficulty breathing. Summary The thyroid gland is a small gland located in the lower front part of the neck, just in front of the windpipe (trachea). Hyperthyroidism is when the thyroid gland is too active (overactive) and produces too much of a hormone called thyroxine. The most common cause is Graves' disease, a disorder in which your immune system attacks the thyroid gland. Hyperthyroidism can cause various symptoms, such as unexplained weight loss, nervousness, inability to tolerate heat, or changes in your heartbeat. Treatment may include medicine to reduce the amount of thyroid hormone your body makes, radioiodine therapy, surgery, or medicines to manage symptoms. This information is not intended to replace advice given to you by your health care provider. Make sure you discuss any questions you have with your healthcare provider. Document Revised: 08/26/2020 Document Reviewed: 08/26/2020 Elsevier Patient Education  2022 [employer]  

## 2022-03-29 LAB — T4, FREE: Free T4: 1.07 ng/dL (ref 0.82–1.77)

## 2022-03-29 LAB — TSH: TSH: 0.422 u[IU]/mL — ABNORMAL LOW (ref 0.450–4.500)

## 2022-03-29 LAB — T3, FREE: T3, Free: 3.3 pg/mL (ref 2.0–4.4)

## 2022-03-29 LAB — THYROGLOBULIN ANTIBODY: Thyroglobulin Antibody: <1 IU/mL (ref 0.0–0.9)

## 2022-03-29 LAB — THYROID PEROXIDASE ANTIBODY: Thyroperoxidase Ab SerPl-aCnc: <9 IU/mL (ref 0–34)

## 2022-04-10 NOTE — Progress Notes (Signed)
? ? ?GI Office Note   ? ?Referring Provider: Elfredia Nevins, MD ?Primary Care Physician:  Elfredia Nevins, MD  ? ? ?Chief Complaint  ? ?Chief Complaint  ?Patient presents with  ? Abdominal Pain  ?  Left side upper pain   ? ? ? ?History of Present Illness  ? ?Brenda Gilbert is a 21 y.o. female with a history of depression, gastritis and H. Pylori s/p eradication confirmed by EGD in 2019 presenting today at the request of Elfredia Nevins, MD for evaluation of abdominal pain and dysphagia. ? ? ?Dysphagia ?Patient complains of feeling like food is getting stuck. The dysphagia occurs with solids Symptoms have been present for approximately 2 months. The symptoms are unchanged. The dysphagia has been intermittent and has not been progressive.  She  has a history of gastritis .  She denies melena, hematochezia, hematemesis, and coffee ground emesis.  This has been associated with dysphagia, early satiety, fullness after meals, upper abdominal discomfort, and frequent hiccups .  She denies belching, bilious reflux, chest pain, choking on food, cough, heartburn, need to clear throat frequently, nocturnal burning, odynophagia, regurgitation of undigested food, shortness of breath, symptoms primarily relate to meals, and lying down after meals, and wheezing. ?History of H. Pylori - took triple therapy. Last EGD 04/30/18 - pathology with gastritis, negative for H. Pylori, report not available in care everywhere. ? ?Abdominal Pain ?Patient complains of abdominal pain. The pain is located in the LUQ. The pain is described as shredding. Onset was 2 months ago. Symptoms have been gradually improving since being started on pantoprazole 40 mg twice daily. Aggravating factors include none, no relationship to meals.  Alleviating factors include proton pump inhibitors. Associated symptoms include early satiety, dysphagia. She reported some tenderness to palpation of mid upper abdomen during her exam with her PCP. The patient denies  anorexia, belching, chills, constipation, diarrhea, dysuria, fever, hematochezia, hematuria, melena, nausea, and vomiting. ? ? ?Past Medical History:  ?Diagnosis Date  ? Chronic abdominal pain   ? Fatigue   ? Helicobacter pylori (H. pylori) infection   ? Nausea and vomiting   ? recurrent  ? ? ?Past Surgical History:  ?Procedure Laterality Date  ? CESAREAN SECTION    ? 03/28/2020 and 12/30/2021  ? ? ?Current Outpatient Medications  ?Medication Sig Dispense Refill  ? citalopram (CELEXA) 20 MG tablet Take 40 mg by mouth daily.    ? EPINEPHrine (EPIPEN 2-PAK) 0.3 mg/0.3 mL IJ SOAJ injection Inject 0.3 mLs (0.3 mg total) into the muscle as needed for anaphylaxis (for severe allergic reaction with difficulty breathing, tongue or lip swelling, chest pain or shortness of breath). 1 each 0  ? pantoprazole (PROTONIX) 40 MG tablet Take 40 mg by mouth 2 (two) times daily.    ? SPRINTEC 28 0.25-35 MG-MCG tablet Take 1 tablet by mouth daily.    ? ?No current facility-administered medications for this visit.  ? ? ?Allergies as of 04/11/2022 - Review Complete 04/11/2022  ?Allergen Reaction Noted  ? 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 ? ?Family History  ?Problem Relation Age of Onset  ? Cancer Mother   ? Thyroid disease Mother   ? Hypertension Mother   ? Cancer Father   ? Diabetes Father   ? Hypertension Father   ? Barrett's esophagus Father   ? Esophageal cancer Father   ?     81 years old  ? Colon polyps Maternal Grandmother   ? Cancer - Colon Neg Hx   ? ? ?  Social History  ? ?Socioeconomic History  ? Marital status: Married  ?  Spouse name: Not on file  ? Number of children: Not on file  ? Years of education: Not on file  ? Highest education level: Not on file  ?Occupational History  ? Not on file  ?Tobacco Use  ? Smoking status: Every Day  ?  Types: E-cigarettes  ? Smokeless tobacco: Never  ?Vaping Use  ? Vaping Use: Every day  ?Substance and Sexual Activity  ? Alcohol use: No  ? Drug use: No  ? Sexual activity: Yes   ?Other Topics Concern  ? Not on file  ?Social History Narrative  ? Not on file  ? ?Social Determinants of Health  ? ?Financial Resource Strain: Not on file  ?Food Insecurity: Not on file  ?Transportation Needs: Not on file  ?Physical Activity: Not on file  ?Stress: Not on file  ?Social Connections: Not on file  ?Intimate Partner Violence: Not on file  ? ? ? ?Review of Systems  ? ?Gen: Denies any fever, chills, fatigue, weight loss, lack of appetite.  ?CV: Denies chest pain, heart palpitations, peripheral edema, syncope.  ?Resp: Denies shortness of breath at rest or with exertion. Denies wheezing or cough.  ?GI: see HPI ?GU : Denies urinary burning, urinary frequency, urinary hesitancy ?MS: Denies joint pain, muscle weakness, cramps, or limitation of movement.  ?Derm: Denies rash, itching, dry skin ?Psych: + depression, Denies memory loss, and confusion ? ?Physical Exam  ? ?BP 118/76   Pulse 86   Temp (!) 97.4 ?F (36.3 ?C) (Temporal)   Ht 5' 3" (1.6 m)   Wt 202 lb 3.2 oz (91.7 kg)   LMP 04/10/2022 (Approximate)   BMI 35.82 kg/m?  ? ?General:   Alert and oriented. Pleasant and cooperative. Well-nourished and well-developed.  ?Head:  Normocephalic and atraumatic. ?Eyes:  Without icterus, sclera clear and conjunctiva pink.  ?Ears:  Normal auditory acuity.  ?Heart:  S1, S2 present without murmurs appreciated.  ?Abdomen:  +BS, soft, non-distended. TTP to epigastrium and left upper quadrant.  No HSM noted. No guarding or rebound. No masses appreciated.  ?Rectal:  Deferred  ?Msk:  Symmetrical without gross deformities. Normal posture. ?Extremities:  Without edema. ?Neurologic:  Alert and  oriented x4;  grossly normal neurologically. ?Psych:  Alert and cooperative. Normal mood and affect. ? ? ?Assessment  ? ?Brenda Gilbert is a 20 y.o. female with a history of depression, gastritis and H. Pylori s/p eradication confirmed by EGD in 2019 presenting today with left upper quadrant pain and dysphagia. ? ?Left upper  quadrant pain: She has reported left upper quadrant pain ongoing for about a little over 8 weeks.  Seen her PCP on 03/15/2022 for this.  PCP prescribed Protonix 40 mg twice daily for which she reports she has had some improvement in this abdominal pain.  Instead of pain being constant her pain is improved to occurring about once or twice a day, feeling like it is being treated and will last for varying amounts of time.  Denies any relationship to food.  Tenderness to palpation of left upper quadrant and epigastrium on exam.  She has history of H. pylori s/p eradication and gastritis which was noted on last EGD in May 2019.  She also has complaints of early satiety, and somewhat a lack of appetite though she feels like this is related to other issues she has been having.  Denies any N/V, recent fever or chills. She has not been   on any medication prior to being prescribed Protonix by her PCP, has not tried any over-the-counter medications.  Advised to continue Protonix 40 mg twice daily, may use famotidine once daily for breakthrough as needed.  We will schedule an EGD with Dr. Jena Gauss in the near future.  ? ?Dysphagia: Recent feelings of food getting stuck at the bottom of her throat, specifically substernal.  Does not note any specific meals or foods that get more stuck than others.  No liquid dysphagia.  As stated above she has a history of gastritis, also notably a family history in her father who had Barrett's and esophageal cancer who is deceased.  Denies any frequent NSAID use.  We will schedule EGD with possible dilation with Dr. Jena Gauss in the near future.  As stated above we will continue Protonix 40 mg twice daily.  ? ?Suspect dysphagia and abdominal pain related to worsening gastritis given recent life stressors, cannot rule out esophageal stricture, Schatzki's ring, duodenitis, Barrett's. ? ? ?PLAN  ? ?Proceed with upper endoscopy by Dr. Jena Gauss in near future: the risks, benefits, and alternatives have been  discussed with the patient in detail. The patient states understanding and desires to proceed. ASA 2 ?Urine pregnancy ?Continue pantoprazole 40 mg twice daily ?Famotidine 10 or 20 mg OTC once daily for breakthrough (either

## 2022-04-10 NOTE — H&P (View-Only) (Signed)
? ? ?GI Office Note   ? ?Referring Provider: Elfredia Nevins, MD ?Primary Care Physician:  Elfredia Nevins, MD  ? ? ?Chief Complaint  ? ?Chief Complaint  ?Patient presents with  ? Abdominal Pain  ?  Left side upper pain   ? ? ? ?History of Present Illness  ? ?Brenda Gilbert is a 21 y.o. female with a history of depression, gastritis and H. Pylori s/p eradication confirmed by EGD in 2019 presenting today at the request of Elfredia Nevins, MD for evaluation of abdominal pain and dysphagia. ? ? ?Dysphagia ?Patient complains of feeling like food is getting stuck. The dysphagia occurs with solids Symptoms have been present for approximately 2 months. The symptoms are unchanged. The dysphagia has been intermittent and has not been progressive.  She  has a history of gastritis .  She denies melena, hematochezia, hematemesis, and coffee ground emesis.  This has been associated with dysphagia, early satiety, fullness after meals, upper abdominal discomfort, and frequent hiccups .  She denies belching, bilious reflux, chest pain, choking on food, cough, heartburn, need to clear throat frequently, nocturnal burning, odynophagia, regurgitation of undigested food, shortness of breath, symptoms primarily relate to meals, and lying down after meals, and wheezing. ?History of H. Pylori - took triple therapy. Last EGD 04/30/18 - pathology with gastritis, negative for H. Pylori, report not available in care everywhere. ? ?Abdominal Pain ?Patient complains of abdominal pain. The pain is located in the LUQ. The pain is described as shredding. Onset was 2 months ago. Symptoms have been gradually improving since being started on pantoprazole 40 mg twice daily. Aggravating factors include none, no relationship to meals.  Alleviating factors include proton pump inhibitors. Associated symptoms include early satiety, dysphagia. She reported some tenderness to palpation of mid upper abdomen during her exam with her PCP. The patient denies  anorexia, belching, chills, constipation, diarrhea, dysuria, fever, hematochezia, hematuria, melena, nausea, and vomiting. ? ? ?Past Medical History:  ?Diagnosis Date  ? Chronic abdominal pain   ? Fatigue   ? Helicobacter pylori (H. pylori) infection   ? Nausea and vomiting   ? recurrent  ? ? ?Past Surgical History:  ?Procedure Laterality Date  ? CESAREAN SECTION    ? 03/28/2020 and 12/30/2021  ? ? ?Current Outpatient Medications  ?Medication Sig Dispense Refill  ? citalopram (CELEXA) 20 MG tablet Take 40 mg by mouth daily.    ? EPINEPHrine (EPIPEN 2-PAK) 0.3 mg/0.3 mL IJ SOAJ injection Inject 0.3 mLs (0.3 mg total) into the muscle as needed for anaphylaxis (for severe allergic reaction with difficulty breathing, tongue or lip swelling, chest pain or shortness of breath). 1 each 0  ? pantoprazole (PROTONIX) 40 MG tablet Take 40 mg by mouth 2 (two) times daily.    ? SPRINTEC 28 0.25-35 MG-MCG tablet Take 1 tablet by mouth daily.    ? ?No current facility-administered medications for this visit.  ? ? ?Allergies as of 04/11/2022 - Review Complete 04/11/2022  ?Allergen Reaction Noted  ? 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 ? ?Family History  ?Problem Relation Age of Onset  ? Cancer Mother   ? Thyroid disease Mother   ? Hypertension Mother   ? Cancer Father   ? Diabetes Father   ? Hypertension Father   ? Barrett's esophagus Father   ? Esophageal cancer Father   ?     81 years old  ? Colon polyps Maternal Grandmother   ? Cancer - Colon Neg Hx   ? ? ?  Social History  ? ?Socioeconomic History  ? Marital status: Married  ?  Spouse name: Not on file  ? Number of children: Not on file  ? Years of education: Not on file  ? Highest education level: Not on file  ?Occupational History  ? Not on file  ?Tobacco Use  ? Smoking status: Every Day  ?  Types: E-cigarettes  ? Smokeless tobacco: Never  ?Vaping Use  ? Vaping Use: Every day  ?Substance and Sexual Activity  ? Alcohol use: No  ? Drug use: No  ? Sexual activity: Yes   ?Other Topics Concern  ? Not on file  ?Social History Narrative  ? Not on file  ? ?Social Determinants of Health  ? ?Financial Resource Strain: Not on file  ?Food Insecurity: Not on file  ?Transportation Needs: Not on file  ?Physical Activity: Not on file  ?Stress: Not on file  ?Social Connections: Not on file  ?Intimate Partner Violence: Not on file  ? ? ? ?Review of Systems  ? ?Gen: Denies any fever, chills, fatigue, weight loss, lack of appetite.  ?CV: Denies chest pain, heart palpitations, peripheral edema, syncope.  ?Resp: Denies shortness of breath at rest or with exertion. Denies wheezing or cough.  ?GI: see HPI ?GU : Denies urinary burning, urinary frequency, urinary hesitancy ?MS: Denies joint pain, muscle weakness, cramps, or limitation of movement.  ?Derm: Denies rash, itching, dry skin ?Psych: + depression, Denies memory loss, and confusion ? ?Physical Exam  ? ?BP 118/76   Pulse 86   Temp (!) 97.4 ?F (36.3 ?C) (Temporal)   Ht 5\' 3"  (1.6 m)   Wt 202 lb 3.2 oz (91.7 kg)   LMP 04/10/2022 (Approximate)   BMI 35.82 kg/m?  ? ?General:   Alert and oriented. Pleasant and cooperative. Well-nourished and well-developed.  ?Head:  Normocephalic and atraumatic. ?Eyes:  Without icterus, sclera clear and conjunctiva pink.  ?Ears:  Normal auditory acuity.  ?Heart:  S1, S2 present without murmurs appreciated.  ?Abdomen:  +BS, soft, non-distended. TTP to epigastrium and left upper quadrant.  No HSM noted. No guarding or rebound. No masses appreciated.  ?Rectal:  Deferred  ?Msk:  Symmetrical without gross deformities. Normal posture. ?Extremities:  Without edema. ?Neurologic:  Alert and  oriented x4;  grossly normal neurologically. ?Psych:  Alert and cooperative. Normal mood and affect. ? ? ?Assessment  ? ?Brenda Gilbert is a 21 y.o. female with a history of depression, gastritis and H. Pylori s/p eradication confirmed by EGD in 2019 presenting today with left upper quadrant pain and dysphagia. ? ?Left upper  quadrant pain: She has reported left upper quadrant pain ongoing for about a little over 8 weeks.  Seen her PCP on 03/15/2022 for this.  PCP prescribed Protonix 40 mg twice daily for which she reports she has had some improvement in this abdominal pain.  Instead of pain being constant her pain is improved to occurring about once or twice a day, feeling like it is being treated and will last for varying amounts of time.  Denies any relationship to food.  Tenderness to palpation of left upper quadrant and epigastrium on exam.  She has history of H. pylori s/p eradication and gastritis which was noted on last EGD in May 2019.  She also has complaints of early satiety, and somewhat a lack of appetite though she feels like this is related to other issues she has been having.  Denies any N/V, recent fever or chills. She has not been  on any medication prior to being prescribed Protonix by her PCP, has not tried any over-the-counter medications.  Advised to continue Protonix 40 mg twice daily, may use famotidine once daily for breakthrough as needed.  We will schedule an EGD with Dr. Jena Gauss in the near future.  ? ?Dysphagia: Recent feelings of food getting stuck at the bottom of her throat, specifically substernal.  Does not note any specific meals or foods that get more stuck than others.  No liquid dysphagia.  As stated above she has a history of gastritis, also notably a family history in her father who had Barrett's and esophageal cancer who is deceased.  Denies any frequent NSAID use.  We will schedule EGD with possible dilation with Dr. Jena Gauss in the near future.  As stated above we will continue Protonix 40 mg twice daily.  ? ?Suspect dysphagia and abdominal pain related to worsening gastritis given recent life stressors, cannot rule out esophageal stricture, Schatzki's ring, duodenitis, Barrett's. ? ? ?PLAN  ? ?Proceed with upper endoscopy by Dr. Jena Gauss in near future: the risks, benefits, and alternatives have been  discussed with the patient in detail. The patient states understanding and desires to proceed. ASA 2 ?Urine pregnancy ?Continue pantoprazole 40 mg twice daily ?Famotidine 10 or 20 mg OTC once daily for breakthrough (either

## 2022-04-11 ENCOUNTER — Telehealth: Payer: Self-pay

## 2022-04-11 ENCOUNTER — Encounter: Payer: Self-pay | Admitting: Gastroenterology

## 2022-04-11 ENCOUNTER — Other Ambulatory Visit: Payer: Self-pay

## 2022-04-11 ENCOUNTER — Ambulatory Visit (INDEPENDENT_AMBULATORY_CARE_PROVIDER_SITE_OTHER): Payer: Medicaid Other | Admitting: Gastroenterology

## 2022-04-11 VITALS — BP 118/76 | HR 86 | Temp 97.4°F | Ht 63.0 in | Wt 202.2 lb

## 2022-04-11 DIAGNOSIS — Z8719 Personal history of other diseases of the digestive system: Secondary | ICD-10-CM | POA: Diagnosis not present

## 2022-04-11 DIAGNOSIS — Z8619 Personal history of other infectious and parasitic diseases: Secondary | ICD-10-CM

## 2022-04-11 DIAGNOSIS — R1319 Other dysphagia: Secondary | ICD-10-CM

## 2022-04-11 DIAGNOSIS — R1013 Epigastric pain: Secondary | ICD-10-CM

## 2022-04-11 DIAGNOSIS — R1012 Left upper quadrant pain: Secondary | ICD-10-CM | POA: Diagnosis not present

## 2022-04-11 DIAGNOSIS — Z800 Family history of malignant neoplasm of digestive organs: Secondary | ICD-10-CM

## 2022-04-11 NOTE — Patient Instructions (Signed)
We are scheduling you for an EGD with propofol with Dr. Jena Gauss in the near future.  We will contact you with results after your procedure.  Follow-up in 2 months post procedure or sooner as needed. ? ?Continue pantoprazole 40 mg twice daily.  May take famotidine 10 or 20 mg once a day for breakthrough symptoms (either lunchtime or at bedtime).  ? ?Lifestyle and home remedies TO MANAGE REFLUX ? ?  ?You may eliminate or reduce the frequency of heartburn by making the following lifestyle changes: ?  ?Control your weight. Being overweight is a major risk factor for heartburn and GERD. Excess pounds put pressure on your abdomen, pushing up your stomach and causing acid to back up into your esophagus.  ?  ?Eat smaller meals. 4 TO 6 MEALS A DAY. This reduces pressure on the lower esophageal sphincter, helping to prevent the valve from opening and acid from washing back into your esophagus. ?  ?  ?Loosen your belt. Clothes that fit tightly around your waist put pressure on your abdomen and the lower esophageal sphincter. ?  ?  ?Eliminate heartburn triggers. Everyone has specific triggers. Common triggers such as fatty or fried foods, spicy food, tomato sauce, carbonated beverages, alcohol, chocolate, mint, garlic, onion, caffeine and nicotine may make heartburn worse.  ?  ?Avoid stooping or bending. Tying your shoes is OK. Bending over for longer periods to weed your garden isn't, especially soon after eating.  ?  ?Don't lie down after a meal. Wait at least three to four hours after eating before going to bed, and don't lie down right after eating.  ?  ?PUT THE HEAD OF YOUR BED ON 6 INCH BLOCKS. ?  ? ?  ? ?Alternative medicine ? ?Several home remedies exist for treating GERD, but they provide only temporary relief. They include drinking baking soda (sodium bicarbonate) added to water or drinking other fluids such as baking soda mixed with cream of tartar and water. ?  ?Although these liquids create temporary relief by  neutralizing, washing away or buffering acids, eventually they aggravate the situation by adding gas and fluid to your stomach, increasing pressure and causing more acid reflux. Further, adding more sodium to your diet may increase your blood pressure and add stress to your heart, and excessive bicarbonate ingestion can alter the acid-base balance in your body. ?  ?Low-Fat Diet ?BREADS, CEREALS, PASTA, RICE, DRIED PEAS, AND BEANS ?These products are high in carbohydrates and most are low in fat. Therefore, they can be increased in the diet as substitutes for fatty foods. They too, however, contain calories and should not be eaten in excess. Cereals can be eaten for snacks as well as for breakfast.  ?  ?FRUITS AND VEGETABLES ?It is good to eat fruits and vegetables. Besides being sources of fiber, both are rich in vitamins and some minerals. They help you get the daily allowances of these nutrients. Fruits and vegetables can be used for snacks and desserts. ?  ?MEATS ?Limit lean meat, chicken, [place], and fish to no more than 6 ounces per day. ?Beef, Pork, and Lamb ?Use lean cuts of beef, pork, and lamb. Lean cuts include:  ?Extra-lean ground beef.  ?Arm roast.  ?Sirloin tip.  ?Center-cut ham.  ?Round steak.  ?Loin chops.  ?Rump roast.  ?Tenderloin.  ?Trim all fat off the outside of meats before cooking. It is not necessary to severely decrease the intake of red meat, but lean choices should be made. Lean meat is rich  in protein and contains a highly absorbable form of iron. Premenopausal women, in particular, should avoid reducing lean red meat because this could increase the risk for low red blood cells (iron-deficiency anemia). ?  ?Chicken and [place] ?These are good sources of protein. The fat of poultry can be reduced by removing the skin and underlying fat layers before cooking. Chicken and [place] can be substituted for lean red meat in the diet. Poultry should not be fried or covered with high-fat sauces. ?Fish  and Shellfish ?Fish is a good source of protein. Shellfish contain cholesterol, but they usually are low in saturated fatty acids. The preparation of fish is important. Like chicken and [place], they should not be fried or covered with high-fat sauces. ?EGGS ?Egg whites contain no fat or cholesterol. They can be eaten often. Try 1 to 2 egg whites instead of whole eggs in recipes or use egg substitutes that do not contain yolk. ?MILK AND DAIRY PRODUCTS ?Use skim or 1% milk instead of 2% or whole milk. Decrease whole milk, natural, and processed cheeses. Use nonfat or low-fat (2%) cottage cheese or low-fat cheeses made from vegetable oils. Choose nonfat or low-fat (1 to 2%) yogurt. Experiment with evaporated skim milk in recipes that call for heavy cream. Substitute low-fat yogurt or low-fat cottage cheese for sour cream in dips and salad dressings. Have at least 2 servings of low-fat dairy products, such as 2 glasses of skim (or 1%) milk each day to help get your daily calcium intake. ?FATS AND OILS ?Reduce the total intake of fats, especially saturated fat. Butterfat, lard, and beef fats are high in saturated fat and cholesterol. These should be avoided as much as possible. Vegetable fats do not contain cholesterol, but certain vegetable fats, such as coconut oil, palm oil, and palm kernel oil are very high in saturated fats. These should be limited. These fats are often used in bakery goods, processed foods, popcorn, oils, and nondairy creamers. Vegetable shortenings and some peanut butters contain hydrogenated oils, which are also saturated fats. Read the labels on these foods and check for saturated vegetable oils. ?Unsaturated vegetable oils and fats do not raise blood cholesterol. However, they should be limited because they are fats and are high in calories. Total fat should still be limited to 30% of your daily caloric intake. Desirable liquid vegetable oils are corn oil, cottonseed oil, olive oil, canola oil,  safflower oil, soybean oil, and sunflower oil. Peanut oil is not as good, but small amounts are acceptable. Buy a heart-healthy tub margarine that has no partially hydrogenated oils in the ingredients. Mayonnaise and salad dressings often are made from unsaturated fats, but they should also be limited because of their high calorie and fat content. ?Seeds, nuts, peanut butter, olives, and avocados are high in fat, but the fat is mainly the unsaturated type. These foods should be limited mainly to avoid excess calories and fat. ?OTHER EATING TIPS ?Snacks  ?Most sweets should be limited as snacks. They tend to be rich in calories and fats, and their caloric content outweighs their nutritional value. Some good choices in snacks are graham crackers, melba toast, soda crackers, bagels (no egg), English muffins, fruits, and vegetables. These snacks are preferable to snack crackers, [place] fries, TORTILLA CHIPS, and POTATO chips. Popcorn should be air-popped or cooked in small amounts of liquid vegetable oil. ?Desserts ?Eat fruit, low-fat yogurt, and fruit ices instead of pastries, cake, and cookies. Sherbet, angel food cake, gelatin dessert, frozen low-fat yogurt, or  other frozen products that do not contain saturated fat (pure fruit juice bars, frozen ice pops) are also acceptable.  ?COOKING METHODS ?Choose those methods that use little or no fat. They include: ?Poaching.  ?Braising.  ?Steaming.  ?Grilling.  ?Baking.  ?Stir-frying.  ?Broiling.  ?Microwaving.  ?Foods can be cooked in a nonstick pan without added fat, or use a nonfat cooking spray in regular cookware. Limit fried foods and avoid frying in saturated fat. Add moisture to lean meats by using water, broth, cooking wines, and other nonfat or low-fat sauces along with the cooking methods mentioned above. ?Soups and stews should be chilled after cooking. The fat that forms on top after a few hours in the refrigerator should be skimmed off. When preparing meals,  avoid using excess salt. Salt can contribute to raising blood pressure in some people. ?  ?EATING AWAY FROM HOME ?Order entr?es, potatoes, and vegetables without sauces or butter. When meat exceeds the size

## 2022-04-11 NOTE — Telephone Encounter (Signed)
PA for EGD/-/+DIL submitted via [facility] website. Case pended. Tracking# [id number]. ?

## 2022-04-12 ENCOUNTER — Other Ambulatory Visit: Payer: Self-pay

## 2022-04-12 ENCOUNTER — Other Ambulatory Visit: Payer: Self-pay | Admitting: Nurse Practitioner

## 2022-04-12 DIAGNOSIS — R7989 Other specified abnormal findings of blood chemistry: Secondary | ICD-10-CM

## 2022-04-12 NOTE — Telephone Encounter (Signed)
EGD/DIL approved. PA# [id number], valid 05/03/22-08/01/22. ?

## 2022-04-13 ENCOUNTER — Ambulatory Visit: Payer: Medicaid Other | Admitting: Nurse Practitioner

## 2022-04-27 ENCOUNTER — Ambulatory Visit (HOSPITAL_COMMUNITY)
Admission: RE | Admit: 2022-04-27 | Discharge: 2022-04-27 | Disposition: A | Discharge status: Home / Self Care | Payer: Medicaid Other | Source: Ambulatory Visit | Attending: Nurse Practitioner | Admitting: Nurse Practitioner

## 2022-04-27 DIAGNOSIS — R7989 Other specified abnormal findings of blood chemistry: Secondary | ICD-10-CM | POA: Diagnosis present

## 2022-05-01 ENCOUNTER — Other Ambulatory Visit (HOSPITAL_COMMUNITY)
Admission: RE | Admit: 2022-05-01 | Discharge: 2022-05-01 | Disposition: A | Discharge status: Home / Self Care | Payer: Medicaid Other | Source: Ambulatory Visit | Attending: Internal Medicine | Admitting: Internal Medicine

## 2022-05-01 ENCOUNTER — Encounter: Payer: Self-pay | Admitting: Nurse Practitioner

## 2022-05-01 ENCOUNTER — Ambulatory Visit (INDEPENDENT_AMBULATORY_CARE_PROVIDER_SITE_OTHER): Payer: Medicaid Other | Admitting: Nurse Practitioner

## 2022-05-01 VITALS — BP 120/87 | HR 94 | Ht 63.0 in | Wt 195.0 lb

## 2022-05-01 DIAGNOSIS — R1012 Left upper quadrant pain: Secondary | ICD-10-CM | POA: Insufficient documentation

## 2022-05-01 DIAGNOSIS — Z8619 Personal history of other infectious and parasitic diseases: Secondary | ICD-10-CM | POA: Insufficient documentation

## 2022-05-01 DIAGNOSIS — Z8719 Personal history of other diseases of the digestive system: Secondary | ICD-10-CM | POA: Insufficient documentation

## 2022-05-01 DIAGNOSIS — R1013 Epigastric pain: Secondary | ICD-10-CM | POA: Insufficient documentation

## 2022-05-01 DIAGNOSIS — E0590 Thyrotoxicosis, unspecified without thyrotoxic crisis or storm: Secondary | ICD-10-CM | POA: Diagnosis not present

## 2022-05-01 DIAGNOSIS — R1319 Other dysphagia: Secondary | ICD-10-CM | POA: Diagnosis present

## 2022-05-01 DIAGNOSIS — R7989 Other specified abnormal findings of blood chemistry: Secondary | ICD-10-CM | POA: Diagnosis not present

## 2022-05-01 LAB — PREGNANCY, URINE: Preg Test, Ur: NEGATIVE

## 2022-05-01 NOTE — Progress Notes (Signed)
? ? ? 05/01/2022   ? ? ?Endocrinology Follow Up Note  ? ? ?Subjective:  ? ? Patient ID: Brenda Gilbert, female    DOB: 09-12-2001, PCP Elfredia Nevins, MD. ? ? ?Past Medical History:  ?Diagnosis Date  ? Chronic abdominal pain   ? Fatigue   ? Helicobacter pylori (H. pylori) infection   ? Nausea and vomiting   ? recurrent  ? ? ?Past Surgical History:  ?Procedure Laterality Date  ? CESAREAN SECTION    ? 03/28/2020 and 12/30/2021  ? ? ?Social History  ? ?Socioeconomic History  ? Marital status: Married  ?  Spouse name: Not on file  ? Number of children: Not on file  ? Years of education: Not on file  ? Highest education level: Not on file  ?Occupational History  ? Not on file  ?Tobacco Use  ? Smoking status: Every Day  ?  Types: E-cigarettes  ? Smokeless tobacco: Never  ?Vaping Use  ? Vaping Use: Every day  ?Substance and Sexual Activity  ? Alcohol use: No  ? Drug use: No  ? Sexual activity: Yes  ?Other Topics Concern  ? Not on file  ?Social History Narrative  ? Not on file  ? ?Social Determinants of Health  ? ?Financial Resource Strain: Not on file  ?Food Insecurity: Not on file  ?Transportation Needs: Not on file  ?Physical Activity: Not on file  ?Stress: Not on file  ?Social Connections: Not on file  ? ? ?Family History  ?Problem Relation Age of Onset  ? Cancer Mother   ? Thyroid disease Mother   ? Hypertension Mother   ? Cancer Father   ? Diabetes Father   ? Hypertension Father   ? Barrett's esophagus Father   ? Esophageal cancer Father   ?     54 years old  ? Colon polyps Maternal Grandmother   ? Cancer - Colon Neg Hx   ? ? ?Outpatient Encounter Medications as of 05/01/2022  ?Medication Sig  ? EPINEPHrine (EPIPEN 2-PAK) 0.3 mg/0.3 mL IJ SOAJ injection Inject 0.3 mLs (0.3 mg total) into the muscle as needed for anaphylaxis (for severe allergic reaction with difficulty breathing, tongue or lip swelling, chest pain or shortness of breath).  ? pantoprazole (PROTONIX) 40 MG tablet Take 40 mg by mouth 2 (two) times daily.   ? [DISCONTINUED] citalopram (CELEXA) 20 MG tablet Take 40 mg by mouth daily.  ? [DISCONTINUED] SPRINTEC 28 0.25-35 MG-MCG tablet Take 1 tablet by mouth daily.  ? ?No facility-administered encounter medications on file as of 05/01/2022.  ? ? ?ALLERGIES: ?Allergies  ?Allergen Reactions  ? Fish-Derived Products Anaphylaxis  ?  seafood  ? ? ?VACCINATION STATUS: ? ?There is no immunization history on file for this patient. ? ? ?HPI ? ?Brenda Gilbert is 21 y.o. female who presents today with a medical history as above. she is being seen in follow up after being seen in consultation for hyperthyroidism requested by Elfredia Nevins, MD.  she has been dealing with symptoms of  anxiety, insomnia, and cold/hot temperature fluctuations for 2 years approximately. These symptoms are progressively worsening and troubling to her.  her most recent thyroid labs revealed mildly suppressed TSH of 0.363 on 03/16/22.  She says her OBGYN was monitoring her thyroid function during this last pregnancy as TSH was slightly low but she does not know of any other incidences where her thyroid levels have been abnormal.  ? ?she denies dysphagia, choking, shortness of breath, no recent voice change.  ?  ?  she does have extensive family history of thyroid dysfunction in her mother (hypothyroidism), grandmother (hyperactive) and aunts, but denies family hx of thyroid cancer. she denies personal history of goiter. she is not on any anti-thyroid medications nor on any thyroid hormone supplements. She has been on hair skin and nail vitamins in the past but has not been taking them in quite some time.  Denies use of Biotin containing supplements.  ? ? ?Review of systems ? ?Constitutional: + stable body weight, current Body mass index is 34.54 kg/m?., no fatigue, + hot/cold temperature fluctuations, + intermittent sudden onset headache and high BP- has been seen in ED for this and released ?Eyes: no blurry vision, no xerophthalmia ?ENT: no sore throat, no  nodules palpated in throat, no dysphagia/odynophagia, no hoarseness ?Cardiovascular: no chest pain, no shortness of breath, no palpitations, no leg swelling ?Respiratory: no cough, no shortness of breath ?Gastrointestinal: no nausea/vomiting/diarrhea ?Musculoskeletal: no muscle/joint aches ?Skin: no rashes, no hyperemia ?Neurological: no tremors, no numbness, no tingling, no dizziness ?Psychiatric: no depression, + anxiety, + insomnia (has history of PTSD) ? ? ?Objective:  ?  ?BP 120/87   Pulse 94   Ht 5\' 3"  (1.6 m)   Wt 195 lb (88.5 kg)   LMP 04/10/2022 (Approximate)   BMI 34.54 kg/m?   ?Wt Readings from Last 3 Encounters:  ?05/01/22 195 lb (88.5 kg)  ?04/11/22 202 lb 3.2 oz (91.7 kg)  ?03/28/22 200 lb 6.4 oz (90.9 kg)  ?  ? ?BP Readings from Last 3 Encounters:  ?05/01/22 120/87  ?04/11/22 118/76  ?03/28/22 104/68  ? ? ?                     ? ?Physical Exam- Limited ? ?Constitutional:  Body mass index is 34.54 kg/m?. , not in acute distress, normal state of mind ?Eyes:  EOMI, no exophthalmos ?Neck: Supple ?Thyroid: Mild fullness noted bilaterally- tender to palpation on right side ?Cardiovascular: RRR, no murmurs, rubs, or gallops, no edema ?Respiratory: Adequate breathing efforts, no crackles, rales, rhonchi, or wheezing ?Musculoskeletal: no gross deformities, strength intact in all four extremities, no gross restriction of joint movements ?Skin:  no rashes, no hyperemia ?Neurological: no tremor, DTR normal in BLE ? ? ?CMP  ?   ?Component Value Date/Time  ? NA 136 08/16/2020 1552  ? K 3.7 08/16/2020 1552  ? CL 105 08/16/2020 1552  ? CO2 23 08/16/2020 1552  ? GLUCOSE 112 (H) 08/16/2020 1552  ? BUN 10 03/16/2022 0000  ? CREATININE 0.8 03/16/2022 0000  ? CREATININE 0.83 08/16/2020 1552  ? CALCIUM 10.0 03/16/2022 0000  ? PROT 7.5 08/16/2020 1552  ? ALBUMIN 4.0 08/16/2020 1552  ? AST 15 08/16/2020 1552  ? ALT 21 08/16/2020 1552  ? ALKPHOS 54 08/16/2020 1552  ? BILITOT 0.4 08/16/2020 1552  ? GFRNONAA >60 08/16/2020  1552  ? GFRAA >60 08/16/2020 1552  ? ? ? ?CBC ?   ?Component Value Date/Time  ? WBC 10.7 (H) 08/16/2020 1552  ? RBC 4.84 08/16/2020 1552  ? HGB 13.2 08/16/2020 1552  ? HCT 40.3 08/16/2020 1552  ? PLT 434 (H) 08/16/2020 1552  ? MCV 83.3 08/16/2020 1552  ? MCH 27.3 08/16/2020 1552  ? MCHC 32.8 08/16/2020 1552  ? RDW 13.0 08/16/2020 1552  ? LYMPHSABS 4.6 (H) 05/21/2020 0025  ? MONOABS 0.6 05/21/2020 0025  ? EOSABS 0.3 05/21/2020 0025  ? BASOSABS 0.0 05/21/2020 0025  ? ? ? ?Diabetic Labs (most recent): ?No results found  for: HGBA1C ? ?Lipid Panel  ?   ?Component Value Date/Time  ? TRIG 94 03/16/2022 0000  ? LDLCALC 148 03/16/2022 0000  ? ? ? ?Lab Results  ?Component Value Date  ? TSH 0.422 (L) 03/28/2022  ? TSH 0.36 (A) 03/16/2022  ? TSH 0.671 06/10/2016  ? FREET4 1.07 03/28/2022  ?  ? ? Latest Reference Range & Units 06/10/16 16:34 03/16/22 00:00 03/28/22 15:36  ?TSH 0.450 - 4.500 uIU/mL 0.671 0.36 ! (E) 0.422 (L)  ?Triiodothyronine,Free,Serum 2.0 - 4.4 pg/mL   3.3  ?T4,Free(Direct) 0.82 - 1.77 ng/dL   1.611.07  ?Thyroperoxidase Ab SerPl-aCnc 0 - 34 IU/mL   <9  ?Thyroglobulin Antibody 0.0 - 0.9 IU/mL   <1.0  ?!: Data is abnormal ?(L): Data is abnormally low ?(E): External lab result ? ?Thyroid US from 04/27/22 ?CLINICAL DATA:  abnormal TSH- mild thyromegaly ?  ?EXAM: ?THYROID ULTRASOUND ?  ?TECHNIQUE: ?Ultrasound examination of the thyroid gland and adjacent soft ?tissues was performed. ?  ?COMPARISON:  None Available. ?  ?FINDINGS: ?Parenchymal Echotexture: Mildly heterogenous ?  ?Isthmus: 0.1 cm ?  ?Right lobe: 5.0 x 1.2 x 2.1 cm ?  ?Left lobe: 4.7 x 1.0 x 2.1 cm ?  ?_________________________________________________________ ?  ?Estimated total number of nodules >/= 1 cm: 1 ?  ?Number of spongiform nodules >/=  2 cm not described below (TR1): 0 ?  ?Number of mixed cystic and solid nodules >/= 1.5 cm not described ?below (TR2): 0 ?  ?_________________________________________________________ ?  ?A single anechoic TR 1 nodule  measuring 1.0 cm is present in the ?inferior aspect of the right hepatic lobe. This nodule does NOT meet ?TI-RADS criteria for biopsy or dedicated follow-up. ?  ?IMPRESSION: ?Thyroid gland measures near t

## 2022-05-03 ENCOUNTER — Encounter (HOSPITAL_COMMUNITY): Payer: Self-pay | Admitting: Internal Medicine

## 2022-05-03 ENCOUNTER — Ambulatory Visit (HOSPITAL_COMMUNITY): Payer: Medicaid Other | Admitting: Anesthesiology

## 2022-05-03 ENCOUNTER — Other Ambulatory Visit: Payer: Self-pay

## 2022-05-03 ENCOUNTER — Encounter (HOSPITAL_COMMUNITY)
Admission: RE | Disposition: A | Discharge status: Home / Self Care | Payer: Self-pay | Source: Home / Self Care | Attending: Internal Medicine

## 2022-05-03 ENCOUNTER — Ambulatory Visit (HOSPITAL_BASED_OUTPATIENT_CLINIC_OR_DEPARTMENT_OTHER): Payer: Medicaid Other | Admitting: Anesthesiology

## 2022-05-03 ENCOUNTER — Ambulatory Visit (HOSPITAL_COMMUNITY)
Admission: RE | Admit: 2022-05-03 | Discharge: 2022-05-03 | Disposition: A | Discharge status: Home / Self Care | Payer: Medicaid Other | Attending: Internal Medicine | Admitting: Internal Medicine

## 2022-05-03 DIAGNOSIS — K219 Gastro-esophageal reflux disease without esophagitis: Secondary | ICD-10-CM | POA: Diagnosis not present

## 2022-05-03 DIAGNOSIS — Z8719 Personal history of other diseases of the digestive system: Secondary | ICD-10-CM | POA: Insufficient documentation

## 2022-05-03 DIAGNOSIS — Z79899 Other long term (current) drug therapy: Secondary | ICD-10-CM | POA: Insufficient documentation

## 2022-05-03 DIAGNOSIS — R1310 Dysphagia, unspecified: Secondary | ICD-10-CM

## 2022-05-03 DIAGNOSIS — F32A Depression, unspecified: Secondary | ICD-10-CM | POA: Diagnosis not present

## 2022-05-03 DIAGNOSIS — Z8619 Personal history of other infectious and parasitic diseases: Secondary | ICD-10-CM | POA: Diagnosis not present

## 2022-05-03 DIAGNOSIS — R1013 Epigastric pain: Secondary | ICD-10-CM | POA: Insufficient documentation

## 2022-05-03 HISTORY — DX: Personal history of urinary calculi: Z87.442

## 2022-05-03 HISTORY — PX: MALONEY DILATION: SHX5535

## 2022-05-03 HISTORY — PX: BIOPSY: SHX5522

## 2022-05-03 HISTORY — PX: ESOPHAGOGASTRODUODENOSCOPY (EGD) WITH PROPOFOL: SHX5813

## 2022-05-03 SURGERY — ESOPHAGOGASTRODUODENOSCOPY (EGD) WITH PROPOFOL
Anesthesia: General

## 2022-05-03 MED ORDER — DEXMEDETOMIDINE (PRECEDEX) IN NS 20 MCG/5ML (4 MCG/ML) IV SYRINGE
PREFILLED_SYRINGE | INTRAVENOUS | Status: DC | PRN
  Administered 2022-05-03: 12 ug via INTRAVENOUS

## 2022-05-03 MED ORDER — LIDOCAINE HCL (CARDIAC) PF 100 MG/5ML IV SOSY
PREFILLED_SYRINGE | INTRAVENOUS | Status: DC | PRN
  Administered 2022-05-03: 50 mg via INTRAVENOUS

## 2022-05-03 MED ORDER — LACTATED RINGERS IV SOLN
INTRAVENOUS | Status: DC
  Administered 2022-05-03: 1000 mL via INTRAVENOUS

## 2022-05-03 MED ORDER — DEXMEDETOMIDINE HCL IN NACL 80 MCG/20ML IV SOLN
INTRAVENOUS | Status: AC
  Filled 2022-05-03: qty 20

## 2022-05-03 MED ORDER — PROPOFOL 10 MG/ML IV BOLUS
INTRAVENOUS | Status: DC | PRN
  Administered 2022-05-03: 150 mg via INTRAVENOUS
  Administered 2022-05-03: 50 mg via INTRAVENOUS

## 2022-05-03 NOTE — Anesthesia Preprocedure Evaluation (Signed)
Anesthesia Evaluation  ?Patient identified by MRN, date of birth, ID band ?Patient awake ? ? ? ?Reviewed: ?Allergy & Precautions, NPO status , Patient's Chart, lab work & pertinent test results ? ?Airway ?Mallampati: II ? ?TM Distance: >3 FB ?Neck ROM: Full ? ? ? Dental ? ?(+) Dental Advisory Given, Chipped,  ?  ?Pulmonary ?Current Smoker and Patient abstained from smoking.,  ?  ?Pulmonary exam normal ?breath sounds clear to auscultation ? ? ? ? ? ? Cardiovascular ?negative cardio ROS ?Normal cardiovascular exam ?Rhythm:Regular Rate:Normal ? ? ?  ?Neuro/Psych ?negative neurological ROS ? negative psych ROS  ? GI/Hepatic ?GERD  Medicated and Poorly Controlled,(+)  ?  ? substance abuse ? alcohol use,   ?Endo/Other  ?negative endocrine ROS ? Renal/GU ?negative Renal ROS  ?negative genitourinary ?  ?Musculoskeletal ?negative musculoskeletal ROS ?(+)  ? Abdominal ?  ?Peds ?negative pediatric ROS ?(+)  Hematology ?negative hematology ROS ?(+)   ?Anesthesia Other Findings ? ? Reproductive/Obstetrics ?negative OB ROS ? ?  ? ? ? ? ? ? ? ? ? ? ? ? ? ?  ?  ? ? ? ? ? ? ? ? ?Anesthesia Physical ?Anesthesia Plan ? ?ASA: 2 ? ?Anesthesia Plan: General  ? ?Post-op Pain Management: Minimal or no pain anticipated  ? ?Induction: Intravenous ? ?PONV Risk Score and Plan: Propofol infusion ? ?Airway Management Planned: Nasal Cannula and Natural Airway ? ?Additional Equipment:  ? ?Intra-op Plan:  ? ?Post-operative Plan:  ? ?Informed Consent: I have reviewed the patients History and Physical, chart, labs and discussed the procedure including the risks, benefits and alternatives for the proposed anesthesia with the patient or authorized representative who has indicated his/her understanding and acceptance.  ? ? ? ?Dental advisory given ? ?Plan Discussed with: CRNA and Surgeon ? ?Anesthesia Plan Comments:   ? ? ? ? ? ? ?Anesthesia Quick Evaluation ? ?

## 2022-05-03 NOTE — Interval H&P Note (Signed)
History and Physical Interval Note: ? ?05/03/2022 ?10:10 AM ? ?Brenda Gilbert  has presented today for surgery, with the diagnosis of epigastric pain, left upper quadrant adominal pain, history of gastritis, history of H. Pylori, dysphagia.  The various methods of treatment have been discussed with the patient and family. After consideration of risks, benefits and other options for treatment, the patient has consented to  Procedure(s) with comments: ?ESOPHAGOGASTRODUODENOSCOPY (EGD) WITH PROPOFOL (N/A) - 10:00am ?MALONEY DILATION (N/A) as a surgical intervention.  The patient's history has been reviewed, patient examined, no change in status, stable for surgery.  I have reviewed the patient's chart and labs.  Questions were answered to the patient's satisfaction.   ? ? ?Brenda Gilbert Brenda Gilbert ? ? ?No change.  Persisting esophageal dysphagia.  EGD with esophageal dilation as feasible/appropriate today per plan. ? ?The risks, benefits, limitations, alternatives and imponderables have been reviewed with the patient. Potential for esophageal dilation, biopsy, etc. have also been reviewed.  Questions have been answered. All parties agreeable.  ?

## 2022-05-03 NOTE — Discharge Instructions (Signed)
EGD ?Discharge instructions ?Please read the instructions outlined below and refer to this sheet in the next few weeks. These discharge instructions provide you with general information on caring for yourself after you leave the hospital. Your doctor may also give you specific instructions. While your treatment has been planned according to the most current medical practices available, unavoidable complications occasionally occur. If you have any problems or questions after discharge, please call your doctor. ?ACTIVITY ?You may resume your regular activity but move at a slower pace for the next 24 hours.  ?Take frequent rest periods for the next 24 hours.  ?Walking will help expel (get rid of) the air and reduce the bloated feeling in your abdomen.  ?No driving for 24 hours (because of the anesthesia (medicine) used during the test).  ?You may shower.  ?Do not sign any important legal documents or operate any machinery for 24 hours (because of the anesthesia used during the test).  ?NUTRITION ?Drink plenty of fluids.  ?You may resume your normal diet.  ?Begin with a light meal and progress to your normal diet.  ?Avoid alcoholic beverages for 24 hours or as instructed by your caregiver.  ?MEDICATIONS ?You may resume your normal medications unless your caregiver tells you otherwise.  ?WHAT YOU CAN EXPECT TODAY ?You may experience abdominal discomfort such as a feeling of fullness or ?gas? pains.  ?FOLLOW-UP ?Your doctor will discuss the results of your test with you.  ?SEEK IMMEDIATE MEDICAL ATTENTION IF ANY OF THE FOLLOWING OCCUR: ?Excessive nausea (feeling sick to your stomach) and/or vomiting.  ?Severe abdominal pain and distention (swelling).  ?Trouble swallowing.  ?Temperature over 101? F (37.8? C).  ?Rectal bleeding or vomiting of blood.   ? ?Your upper GI tract appeared normal today ? ?Your esophagus was stretched ? ?Esophagus was biopsied ? ?Further recommendations to follow pending review of pathology  report ? ?Office visit with Brooke Bonito in 6 to 8 weeks ? ?At patient request, I called Butch at [phone] findings and recommendation ?

## 2022-05-03 NOTE — Anesthesia Procedure Notes (Signed)
Date/Time: 05/03/2022 10:25 AM ?Performed by: Orlie Dakin, CRNA ?Pre-anesthesia Checklist: Suction available, Patient identified, Emergency Drugs available and Patient being monitored ?Patient Re-evaluated:Patient Re-evaluated prior to induction ?Oxygen Delivery Method: Nasal cannula ?Induction Type: IV induction ?Placement Confirmation: positive ETCO2 ? ? ? ? ?

## 2022-05-03 NOTE — Op Note (Signed)
[facility] ?Patient Name: Brenda SorensonLaycie Gilbert ?Procedure Date: 05/03/2022 10:09 AM ?MRN: [id number] ?Date of Birth: 2001-06-26 ?Attending MD: Gennette Pacobert Michael Kelee Cunningham , MD ?CSN: [id number] ?Age: [age] ?Admit Type: Outpatient ?Procedure:                Upper GI endoscopy ?Indications:              Epigastric abdominal pain, Dysphagia ?Providers:                Gennette Pacobert Michael Khali Perella, MD, Jannett CelestineAnitra Bell, RN, Marylene LandAngela  ?                          Jim DesanctisA. Collins RN, RN ?Referring MD:              ?Medicines:                Propofol per Anesthesia ?Complications:            No immediate complications. ?Estimated Blood Loss:     Estimated blood loss was minimal. ?Procedure:                Pre-Anesthesia Assessment: ?                          - Prior to the procedure, a History and Physical  ?                          was performed, and patient medications and  ?                          allergies were reviewed. The patient's tolerance of  ?                          previous anesthesia was also reviewed. The risks  ?                          and benefits of the procedure and the sedation  ?                          options and risks were discussed with the patient.  ?                          All questions were answered, and informed consent  ?                          was obtained. Prior Anticoagulants: The patient has  ?                          taken no previous anticoagulant or antiplatelet  ?                          agents. ASA Grade Assessment: II - A patient with  ?                          mild systemic disease. After reviewing the risks  ?  and benefits, the patient was deemed in  ?                          satisfactory condition to undergo the procedure. ?                          After obtaining informed consent, the endoscope was  ?                          passed under direct vision. Throughout the  ?                          procedure, the patient's blood pressure, pulse, and  ?                           oxygen saturations were monitored continuously. The  ?                          GIF-H190 ([id number]) scope was introduced through the  ?                          mouth, and advanced to the second part of duodenum.  ?                          The upper GI endoscopy was accomplished without  ?                          difficulty. The patient tolerated the procedure  ?                          well. ?Scope In: 10:21:54 AM ?Scope Out: 10:28:32 AM ?Total Procedure Duration: 0 hours 6 minutes 38 seconds  ?Findings: ?     The examined esophagus was normal. ?     The entire examined stomach was normal. ?     The duodenal bulb and second portion of the duodenum were normal. The  ?     scope was withdrawn. Dilation was performed with a Maloney dilator with  ?     mild resistance at 54 Fr. The dilation site was examined following  ?     endoscope reinsertion and showed no change. Estimated blood loss: none.  ?     Finally, biopsies of mid and distal esophagus were taken for histologic  ?     study. ?Impression:               - Normal esophagus. Dilated and subsequently  ?                          biopsied. ?                          - Normal stomach. ?                          - Normal duodenal bulb and second portion of the  ?  duodenum. ?Moderate Sedation: ?     Moderate (conscious) sedation was personally administered by an  ?     anesthesia professional. The following parameters were monitored: oxygen  ?     saturation, heart rate, blood pressure, and response to care. ?Recommendation:           - Patient has a contact number available for  ?                          emergencies. The signs and symptoms of potential  ?                          delayed complications were discussed with the  ?                          patient. Return to normal activities tomorrow.  ?                          Written discharge instructions were provided to the  ?                          patient. ?                           - Advance diet as tolerated. ?                          - Continue present medications. Office visit with  ?                          [place] in 6 to 8 weeks. Follow-up on pathology. ?Procedure Code(s):        --- Professional --- ?                          [phone], Esophagogastroduodenoscopy, flexible,  ?                          transoral; diagnostic, including collection of  ?                          specimen(s) by brushing or washing, when performed  ?                          (separate procedure) ?                          43450, Dilation of esophagus, by unguided sound or  ?                          bougie, single or multiple passes ?Diagnosis Code(s):        --- Professional --- ?                          R10.13, Epigastric pain ?                          R13.10, Dysphagia, unspecified ?CPT  copyright 2019 American Medical Association. All rights reserved. ?The codes documented in this report are preliminary and upon coder review may  ?be revised to meet current compliance requirements. ?Gerrit Friends. Lynell Kussman, MD ?Gennette Pac, MD ?05/03/2022 10:36:33 AM ?This report has been signed electronically. ?Number of Addenda: 0 ?

## 2022-05-03 NOTE — Transfer of Care (Signed)
Immediate Anesthesia Transfer of Care Note ? ?Patient: Ivanka Coleson ? ?Procedure(s) Performed: ESOPHAGOGASTRODUODENOSCOPY (EGD) WITH PROPOFOL ?MALONEY DILATION ?BIOPSY ? ?Patient Location: Endoscopy Unit ? ?Anesthesia Type:General ? ?Level of Consciousness: drowsy ? ?Airway & Oxygen Therapy: Patient Spontanous Breathing ? ?Post-op Assessment: Report given to RN and Post -op Vital signs reviewed and stable ? ?Post vital signs: Reviewed and stable ? ?Last Vitals:  ?Vitals Value Taken Time  ?BP    ?Temp    ?Pulse    ?Resp    ?SpO2    ? ? ?Last Pain:  ?Vitals:  ? 05/03/22 0856  ?TempSrc: Axillary  ?PainSc: 0-No pain  ?   ? ?Patients Stated Pain Goal: 8 (05/03/22 0856) ? ?Complications: No notable events documented. ?

## 2022-05-04 LAB — SURGICAL PATHOLOGY

## 2022-05-04 NOTE — Anesthesia Postprocedure Evaluation (Signed)
Anesthesia Post Note ? ?Patient: Brenda Gilbert ? ?Procedure(s) Performed: ESOPHAGOGASTRODUODENOSCOPY (EGD) WITH PROPOFOL ?MALONEY DILATION ?BIOPSY ? ?Patient location during evaluation: Endoscopy ?Anesthesia Type: General ?Level of consciousness: awake and alert and oriented ?Pain management: pain level controlled ?Vital Signs Assessment: post-procedure vital signs reviewed and stable ?Respiratory status: spontaneous breathing, nonlabored ventilation and respiratory function stable ?Cardiovascular status: blood pressure returned to baseline and stable ?Postop Assessment: no apparent nausea or vomiting ?Anesthetic complications: no ? ? ?No notable events documented. ? ? ?Last Vitals:  ?Vitals:  ? 05/03/22 1031 05/03/22 1042  ?BP: (!) 78/39 101/66  ?Pulse:    ?Resp: 17 18  ?Temp: (!) 36.3 ?C   ?SpO2: 97% 97%  ?  ?Last Pain:  ?Vitals:  ? 05/03/22 1031  ?TempSrc: Oral  ?PainSc: 0-No pain  ? ? ?  ?  ?  ?  ?  ?  ? ?Fredie Majano C Yukari Flax ? ? ? ? ?

## 2022-05-05 ENCOUNTER — Encounter: Payer: Self-pay | Admitting: Internal Medicine

## 2022-05-10 ENCOUNTER — Encounter (HOSPITAL_COMMUNITY): Payer: Self-pay | Admitting: Internal Medicine

## 2022-05-11 ENCOUNTER — Other Ambulatory Visit: Payer: Self-pay

## 2022-05-11 ENCOUNTER — Emergency Department (HOSPITAL_COMMUNITY): Payer: Medicaid Other

## 2022-05-11 ENCOUNTER — Emergency Department (HOSPITAL_COMMUNITY)
Admission: EM | Admit: 2022-05-11 | Discharge: 2022-05-11 | Disposition: A | Discharge status: Home / Self Care | Payer: Medicaid Other | Attending: Student | Admitting: Student

## 2022-05-11 ENCOUNTER — Encounter (HOSPITAL_COMMUNITY): Payer: Self-pay

## 2022-05-11 DIAGNOSIS — J039 Acute tonsillitis, unspecified: Secondary | ICD-10-CM

## 2022-05-11 DIAGNOSIS — R519 Headache, unspecified: Secondary | ICD-10-CM

## 2022-05-11 LAB — I-STAT CHEM 8, ED
BUN: 7 mg/dL (ref 6–20)
Calcium, Ion: 1.22 mmol/L (ref 1.15–1.40)
Chloride: 102 mmol/L (ref 98–111)
Creatinine, Ser: 0.8 mg/dL (ref 0.44–1.00)
Glucose, Bld: 98 mg/dL (ref 70–99)
HCT: 45 % (ref 36.0–46.0)
Hemoglobin: 15.3 g/dL — ABNORMAL HIGH (ref 12.0–15.0)
Potassium: 4.5 mmol/L (ref 3.5–5.1)
Sodium: 137 mmol/L (ref 135–145)
TCO2: 24 mmol/L (ref 22–32)

## 2022-05-11 LAB — GROUP A STREP BY PCR: Group A Strep by PCR: NOT DETECTED

## 2022-05-11 MED ORDER — LIDOCAINE VISCOUS HCL 2 % MT SOLN: 5.0000 mL | Freq: Three times a day (TID) | OROMUCOSAL | 0 refills | Status: DC | PRN

## 2022-05-11 MED ORDER — AMOXICILLIN 500 MG PO CAPS: 500.0000 mg | ORAL_CAPSULE | Freq: Two times a day (BID) | ORAL | 0 refills | Status: DC

## 2022-05-11 NOTE — ED Provider Notes (Signed)
[facility] EMERGENCY DEPARTMENT Provider Note   CSN: [id number] Arrival date & time: 05/11/22  4098     History  No chief complaint on file.   Brenda Gilbert is a 21 y.o. female.  HPI     Brenda Gilbert is a 21 y.o. female past medical history of migraine headaches, who presents to the Emergency Department for evaluation of intermittent episodes of elevated blood pressure and headache.  She states the symptoms began approximately 3 weeks ago.  She developed a sudden onset headache that was associated with left-sided numbness and tingling for approximately 2 to 3 hours.  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  She was out of town at the time but went to a local ED but left without being seen after symptoms resolved.  She has had several similar episodes since then.  She was at her PCPs office this morning for evaluation of sore throat.  She was told to come to the emergency department for further evaluation of her symptoms and CT head.  Last episode of her elevated blood pressure and headache was 2 weeks ago.  She is having a sore throat at this time but denies any other symptoms.    She is currently being evaluated by endocrinology for hyperthyroidism.  She also notes mother has history of hemiplegic migraine headaches.   Home Medications Prior to Admission medications   Medication Sig Start Date End Date Taking? Authorizing Provider  acetaminophen (TYLENOL) 160 MG chewable tablet Chew 320-480 mg by mouth every 6 (six) hours as needed for pain.    [provider]  EPINEPHrine (EPIPEN 2-PAK) 0.3 mg/0.3 mL IJ SOAJ injection Inject 0.3 mLs (0.3 mg total) into the muscle as needed for anaphylaxis (for severe allergic reaction with difficulty breathing, tongue or lip swelling, chest pain or shortness of breath). 05/21/20   Rancour, Jeannett Senior, MD  ibuprofen (ADVIL) 100 MG chewable tablet Chew 300 mg by mouth every 8 (eight)  hours as needed for moderate pain.    [provider]  pantoprazole (PROTONIX) 40 MG tablet Take 40 mg by mouth 2 (two) times daily. 03/15/22   [provider]      Allergies    Fish oil and Fish-derived products    Review of Systems   Review of Systems  Constitutional:  Negative for chills and fever.  HENT:  Positive for sore throat and trouble swallowing. Negative for congestion, ear pain and voice change.   Respiratory:  Negative for cough, chest tightness and shortness of breath.   Cardiovascular:  Negative for chest pain.  Gastrointestinal:  Negative for abdominal pain, nausea and vomiting.  Genitourinary:  Negative for dysuria.  Musculoskeletal:  Negative for arthralgias, neck pain and neck stiffness.  Skin:  Negative for color change and rash.  Neurological:  Positive for headaches. Negative for dizziness, weakness and numbness.   Physical Exam Updated Vital Signs BP 132/86   Pulse (!) 106   Temp 98.4 F (36.9 C) (Oral)   Resp 18   Ht 5\' 3"  (1.6 m)   Wt 88 kg   LMP 04/23/2022   SpO2 97%   BMI 34.37 kg/m  Physical Exam Vitals and nursing note reviewed.  Constitutional:      General: She is not in acute distress.    Appearance: Normal appearance. She is not toxic-appearing.  HENT:     Mouth/Throat:     Mouth: Mucous membranes are moist.  Pharynx: Oropharynx is clear. Posterior oropharyngeal erythema present. No oropharyngeal exudate.     Comments: Diffuse erythema of the oropharynx bilateral tonsils.  Mild tonsillar edema bilaterally, no tonsillar exudate noted on exam.  Uvula is bifurcated, midline and nonedematous.  No swelling or bulging of the soft palate Cardiovascular:     Rate and Rhythm: Normal rate and regular rhythm.     Pulses: Normal pulses.  Pulmonary:     Effort: Pulmonary effort is normal.     Breath sounds: Normal breath sounds.  Abdominal:     Palpations: Abdomen is soft.     Tenderness: There is no abdominal tenderness.   Musculoskeletal:        General: No tenderness.     Right lower leg: No edema.     Left lower leg: No edema.  Skin:    General: Skin is warm.     Capillary Refill: Capillary refill takes less than 2 seconds.     Findings: No rash.  Neurological:     General: No focal deficit present.     Mental Status: She is alert.     GCS: GCS eye subscore is 4. GCS verbal subscore is 5. GCS motor subscore is 6.     Sensory: Sensation is intact. No sensory deficit.     Motor: Motor function is intact. No weakness.     Coordination: Coordination is intact.     Comments: CN II-XII intact.  Speech clear.  No pronator drift.      ED Results / Procedures / Treatments   Labs (all labs ordered are listed, but only abnormal results are displayed) Labs Reviewed  I-STAT CHEM 8, ED - Abnormal; Notable for the following components:      Result Value   Hemoglobin 15.3 (*)    All other components within normal limits  GROUP A STREP BY PCR    EKG None  Radiology CT Head Wo Contrast  Result Date: 05/11/2022 CLINICAL DATA:  Transient ischemic attack (TIA). Additional history provided: Left-sided numbness with elevated blood pressure for 3 weeks. EXAM: CT HEAD WITHOUT CONTRAST TECHNIQUE: Contiguous axial images were obtained from the base of the skull through the vertex without intravenous contrast. RADIATION DOSE REDUCTION: This exam was performed according to the departmental dose-optimization program which includes automated exposure control, adjustment of the mA and/or kV according to patient size and/or use of iterative reconstruction technique. COMPARISON:  Brain MRI 03/16/2017.  Head CT 02/27/2017. FINDINGS: Brain: Cerebral volume is normal. There is no acute intracranial hemorrhage. No demarcated cortical infarct. No extra-axial fluid collection. No evidence of an intracranial mass. No midline shift. Vascular: No hyperdense vessel. Skull: No acute fracture or aggressive osseous lesion. Sinuses/Orbits: No  mass or acute finding within the imaged orbits. No significant paranasal sinus disease at the imaged levels. IMPRESSION: Unremarkable non-contrast CT appearance of the brain. No evidence of acute intracranial abnormality. Electronically Signed   By: Jackey Loge D.O.   On: 05/11/2022 11:40    Procedures Procedures    Medications Ordered in ED Medications - No data to display  ED Course/ Medical Decision Making/ A&P                           Medical Decision Making Patient here for evaluation of sore throat, elevated blood pressure, headaches and intermittent episodes of numbness of the left side.  Had 1 episode of flaccidity of the left side 3 weeks ago was associated with  headache.  Was seen at out of town ER but left prior to be evaluated.  Has had several episodes of elevated blood pressure and headache since then.  Was seen by PCP and sent here for further evaluation and CT head.  Patient denies any symptoms at this time.  On exam, she is well-appearing nontoxic.  She does have significant erythema of the oropharynx no bulging of the soft palate or shift of the uvula.  No clinical symptoms concerning for PTA or retropharyngeal abscess.  No focal neurodeficits on exam today.  Amount and/or Complexity of Data Reviewed External Data Reviewed: notes.    Details: External records reviewed Labs: ordered.    Details: Labs strep PCR negative, Chem-8 markable. Radiology: ordered.    Details: CT head without acute intracranial finding Discussion of management or test interpretation with external provider(s): Patient having intermittent episodes of headache, elevated blood pressure and numbness of the left side associated with headaches.  She is currently being followed by endocrinology for work-up of hyper thyroidism.    Patient without symptoms currently other than sore throat.  CT head today reassuring.  I feel that she is appropriate for discharge home.  Likely tonsillitis, no concerning  symptoms for abscess.  Will treat with antibiotics.  Recommended IM Bicillin but patient declined agreeable to take oral antibiotics instead.  Will provide follow-up information for local neurology as she will likely need further nonemergent evaluation of her headaches.    Risk Prescription drug management.           Final Clinical Impression(s) / ED Diagnoses Final diagnoses:  Tonsillitis  Frequent headaches    Rx / DC Orders ED Discharge Orders     None         Pauline Ausriplett, Natanya Holecek, PA-C 05/11/22 1217    Glendora ScoreKommor, [place], MD 05/12/22 [id number]

## 2022-05-11 NOTE — Discharge Instructions (Signed)
It is important that you take the antibiotic as directed until its finished.  You may use the mouthwash as directed for throat pain.  Also, please contact the neurology office listed to arrange a follow-up appointment.  Return to the emergency department for any new or worsening symptoms.

## 2022-05-11 NOTE — ED Triage Notes (Signed)
Pt sent by Dr Lamar Blinks office, states 3 weeks ago her BP shot up (unsure of number) and she went numb on the left side lasting approx 2-3 hours, went to ED but LWBS. Pt states she feels fine today. Pt states she has not had an episode since.

## 2022-05-11 NOTE — ED Notes (Signed)
I-stat running at this time.

## 2022-06-13 ENCOUNTER — Encounter: Payer: Self-pay | Admitting: Gastroenterology

## 2022-06-13 NOTE — Progress Notes (Deleted)
GI Office Note    Referring Provider: Elfredia Nevins, MD Primary Care Physician:  Elfredia Nevins, MD Primary GI: Dr. Jena Gauss  Date:  06/13/2022  ID:  Laverda Sorenson, DOB December 14, 2001, MRN [id number]   Chief Complaint   No chief complaint on file.    History of Present Illness  Arionna Hoggard is a 21 y.o. female with a history of depression, gastritis and H. pylori s/p eradication confirmed by EGD in 2019*** presenting today for follow up post procedure.   Last seen in the office 04/11/22 with complaint of abdominal pain and dysphagia.  Reported history of H. pylori eradicated with triple therapy confirmed in 2019 by EGD.  She reported solid food dysphagia for about 2 months that is intermittent.  She also reported early satiety and upper abdominal discomfort and frequent hiccups.  Abdominal pain primarily located in the epigastric and left upper quadrant.  Reported it was improved since being started on pantoprazole 40 mg twice daily, unable to determine any aggravating factors.  Continue PPI twice daily and advised famotidine 10 or 20 mg over-the-counter once daily for breakthrough symptoms and provided GERD lifestyle modifications.  EGD 05/03/22 -normal esophagus s/p dilation and biopsy (benign), normal stomach, normal duodenum  Today: Dysphagia -   GERD -   Abdominal pain -   Past Medical History:  Diagnosis Date   Chronic abdominal pain    Fatigue    Helicobacter pylori (H. pylori) infection    History of kidney stones    Nausea and vomiting    recurrent    Past Surgical History:  Procedure Laterality Date   BIOPSY  05/03/2022   Procedure: BIOPSY;  Surgeon: Corbin Ade, MD;  Location: [facility];  Service: Endoscopy;;   CESAREAN SECTION     03/28/2020 and 12/30/2021   ESOPHAGOGASTRODUODENOSCOPY (EGD) WITH PROPOFOL N/A 05/03/2022   Procedure: ESOPHAGOGASTRODUODENOSCOPY (EGD) WITH PROPOFOL;  Surgeon: Corbin Ade, MD;  Location: [facility];  Service: Endoscopy;   Laterality: N/A;  10:00am   MALONEY DILATION N/A 05/03/2022   Procedure: Elease Hashimoto DILATION;  Surgeon: Corbin Ade, MD;  Location: [facility];  Service: Endoscopy;  Laterality: N/A;    Current Outpatient Medications  Medication Sig Dispense Refill   amoxicillin (AMOXIL) 500 MG capsule Take 1 capsule (500 mg total) by mouth 2 (two) times daily. 20 capsule 0   EPINEPHrine (EPIPEN 2-PAK) 0.3 mg/0.3 mL IJ SOAJ injection Inject 0.3 mLs (0.3 mg total) into the muscle as needed for anaphylaxis (for severe allergic reaction with difficulty breathing, tongue or lip swelling, chest pain or shortness of breath). 1 each 0   magic mouthwash (lidocaine, diphenhydrAMINE, alum & mag hydroxide) suspension Swish and spit 5 mLs 3 (three) times daily as needed for mouth pain. 360 mL 0   pantoprazole (PROTONIX) 40 MG tablet Take 40 mg by mouth 2 (two) times daily.     No current facility-administered medications for this visit.    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/2015    Family History  Problem Relation Age of Onset   Cancer Mother    Thyroid disease Mother    Hypertension Mother    Cancer Father    Diabetes Father    Hypertension Father    Barrett's esophagus Father    Esophageal cancer Father        43 years old   Colon polyps Maternal Grandmother  Cancer - Colon Neg Hx     Social History   Socioeconomic History   Marital status: Married    Spouse name: Not on file   Number of children: Not on file   Years of education: Not on file   Highest education level: Not on file  Occupational History   Not on file  Tobacco Use   Smoking status: Every Day    Types: E-cigarettes   Smokeless tobacco: Never  Vaping Use   Vaping Use: Every day  Substance and Sexual Activity   Alcohol use: Yes    Comment: rare use   Drug use: No   Sexual activity: Yes  Other Topics  Concern   Not on file  Social History Narrative   Not on file   Social Determinants of Health   Financial Resource Strain: Not on file  Food Insecurity: Not on file  Transportation Needs: Not on file  Physical Activity: Not on file  Stress: Not on file  Social Connections: Not on file     Review of Systems   Gen: Denies fever, chills, anorexia. Denies fatigue, weakness, weight loss.  CV: Denies chest pain, palpitations, syncope, peripheral edema, and claudication. Resp: Denies dyspnea at rest, cough, wheezing, coughing up blood, and pleurisy. GI: Denies vomiting blood, jaundice, and fecal incontinence.   Denies dysphagia or odynophagia. Derm: Denies rash, itching, dry skin Psych: Denies depression, anxiety, memory loss, confusion. No homicidal or suicidal ideation.  Heme: Denies bruising, bleeding, and enlarged lymph nodes.   Physical Exam   There were no vitals taken for this visit.  General:   Alert and oriented. No distress noted. Pleasant and cooperative.  Head:  Normocephalic and atraumatic. Eyes:  Conjuctiva clear without scleral icterus. Mouth:  Oral mucosa pink and moist. Good dentition. No lesions. Lungs:  Clear to auscultation bilaterally. No wheezes, rales, or rhonchi. No distress.  Heart:  S1, S2 present without murmurs appreciated.  Abdomen:  +BS, soft, non-tender and non-distended. No rebound or guarding. No HSM or masses noted. Rectal: *** Msk:  Symmetrical without gross deformities. Normal posture. Extremities:  Without edema. Neurologic:  Alert and  oriented x4 Psych:  Alert and cooperative. Normal mood and affect.   Assessment  Donyae Kohn is a 21 y.o. female with a history of depression, gastritis and H. pylori s/p eradication confirmed by EGD in 2019*** presenting today for follow up post procedure.   Dysphagia: Recent empiric dilation on EGD 05/03/22. No identified esophageal webbing, stenosis, or Shatzki's ring.   GERD: On pantoprazole 40 mg  BID.    PLAN   ***     Brooke Bonito, MSN, FNP-BC, AGACNP-BC [facility]

## 2022-06-14 ENCOUNTER — Ambulatory Visit: Payer: Medicaid Other | Admitting: Gastroenterology

## 2022-06-15 ENCOUNTER — Encounter: Payer: Self-pay | Admitting: Internal Medicine

## 2022-07-22 LAB — TSH: TSH: 0.549 u[IU]/mL (ref 0.450–4.500)

## 2022-07-22 LAB — T3, FREE: T3, Free: 3.6 pg/mL (ref 2.0–4.4)

## 2022-07-22 LAB — T4, FREE: Free T4: 1.01 ng/dL (ref 0.82–1.77)

## 2022-08-02 ENCOUNTER — Encounter: Payer: Self-pay | Admitting: Nurse Practitioner

## 2022-08-02 ENCOUNTER — Ambulatory Visit (INDEPENDENT_AMBULATORY_CARE_PROVIDER_SITE_OTHER): Payer: Medicaid Other | Admitting: Nurse Practitioner

## 2022-08-02 VITALS — BP 124/81 | HR 61 | Ht 63.0 in | Wt 190.0 lb

## 2022-08-02 DIAGNOSIS — R7989 Other specified abnormal findings of blood chemistry: Secondary | ICD-10-CM

## 2022-08-02 DIAGNOSIS — E0590 Thyrotoxicosis, unspecified without thyrotoxic crisis or storm: Secondary | ICD-10-CM | POA: Diagnosis not present

## 2022-08-02 NOTE — Progress Notes (Signed)
08/02/2022     Endocrinology Follow Up Note    Subjective:    Patient ID: Brenda Gilbert, female    DOB: 2001/07/25, PCP Redmond School, MD.   Past Medical History:  Diagnosis Date   Chronic abdominal pain    Fatigue    Helicobacter pylori (H. pylori) infection    History of kidney stones    Nausea and vomiting    recurrent    Past Surgical History:  Procedure Laterality Date   BIOPSY  05/03/2022   Procedure: BIOPSY;  Surgeon: Daneil Dolin, MD;  [facility];  Service: Endoscopy;;   CESAREAN SECTION     03/28/2020 and 12/30/2021   ESOPHAGOGASTRODUODENOSCOPY (EGD) WITH PROPOFOL N/A 05/03/2022   Rourk: normal esophagus s/p dilation and biopsy (benign), normal stomach, normal duodenum   MALONEY DILATION N/A 05/03/2022   Procedure: MALONEY DILATION;  Surgeon: Daneil Dolin, MD;  [facility];  Service: Endoscopy;  Laterality: N/A;    Social History   Socioeconomic History   Marital status: Married    Spouse name: Not on file   Number of children: Not on file   Years of education: Not on file   Highest education level: Not on file  Occupational History   Not on file  Tobacco Use   Smoking status: Every Day    Types: E-cigarettes   Smokeless tobacco: Never  Vaping Use   Vaping Use: Every day  Substance and Sexual Activity   Alcohol use: Yes    Comment: rare use   Drug use: No   Sexual activity: Yes  Other Topics Concern   Not on file  Social History Narrative   Not on file   Social Determinants of Health   Financial Resource Strain: Not on file  Food Insecurity: Not on file  Transportation Needs: Not on file  Physical Activity: Not on file  Stress: Not on file  Social Connections: Not on file    Family History  Problem Relation Age of Onset   Cancer Mother    Thyroid disease Mother    Hypertension Mother    Cancer Father    Diabetes Father    Hypertension Father    Barrett's esophagus Father    Esophageal cancer  Father        11 years old   Colon polyps Maternal Grandmother    Cancer - Colon Neg Hx     Outpatient Encounter Medications as of 08/02/2022  Medication Sig   EPINEPHrine (EPIPEN 2-PAK) 0.3 mg/0.3 mL IJ SOAJ injection Inject 0.3 mLs (0.3 mg total) into the muscle as needed for anaphylaxis (for severe allergic reaction with difficulty breathing, tongue or lip swelling, chest pain or shortness of breath).   [DISCONTINUED] amoxicillin (AMOXIL) 500 MG capsule Take 1 capsule (500 mg total) by mouth 2 (two) times daily.   [DISCONTINUED] magic mouthwash (lidocaine, diphenhydrAMINE, alum & mag hydroxide) suspension Swish and spit 5 mLs 3 (three) times daily as needed for mouth pain.   [DISCONTINUED] pantoprazole (PROTONIX) 40 MG tablet Take 40 mg by mouth 2 (two) times daily.   No facility-administered encounter medications on file as of 08/02/2022.    ALLERGIES: Allergies  Allergen Reactions   Fish Oil Anaphylaxis   Fish-Derived Products Anaphylaxis    seafood    VACCINATION STATUS:  There is no immunization history on file for this patient.   HPI  Brenda Gilbert is 21 y.o. female who presents today with a medical history as  above. she is being seen in follow up after being seen in consultation for hyperthyroidism requested by Elfredia Nevins, MD.  she has been dealing with symptoms of  anxiety, insomnia, and cold/hot temperature fluctuations for 2 years approximately. These symptoms are progressively worsening and troubling to her.  her most recent thyroid labs revealed mildly suppressed TSH of 0.363 on 03/16/22.  She says her OBGYN was monitoring her thyroid function during this last pregnancy as TSH was slightly low but she does not know of any other incidences where her thyroid levels have been abnormal.   she denies dysphagia, choking, shortness of breath, no recent voice change.    she does have extensive family history of thyroid dysfunction in her mother (hypothyroidism), grandmother  (hyperactive) and aunts, but denies family hx of thyroid cancer. she denies personal history of goiter. she is not on any anti-thyroid medications nor on any thyroid hormone supplements. She has been on hair skin and nail vitamins in the past but has not been taking them in quite some time.  Denies use of Biotin containing supplements.    Review of systems  Constitutional: + stable body weight, current Body mass index is 33.66 kg/m., no fatigue, + hot/cold temperature fluctuations, + intermittent sudden onset headache and high BP- has been seen in ED for this and released Eyes: no blurry vision, no xerophthalmia ENT: no sore throat, no nodules palpated in throat, no dysphagia/odynophagia, no hoarseness Cardiovascular: no chest pain, no shortness of breath, no palpitations, no leg swelling Respiratory: no cough, no shortness of breath Gastrointestinal: no nausea/vomiting/diarrhea Musculoskeletal: no muscle/joint aches Skin: no rashes, no hyperemia Neurological: no tremors, no numbness, no tingling, no dizziness Psychiatric: no depression, + anxiety, + insomnia (has history of PTSD)   Objective:    BP 124/81   Pulse 61   Ht 5\' 3"  (1.6 m)   Wt 190 lb (86.2 kg)   BMI 33.66 kg/m   Wt Readings from Last 3 Encounters:  08/02/22 190 lb (86.2 kg)  05/11/22 194 lb (88 kg)  05/03/22 194 lb (88 kg)     BP Readings from Last 3 Encounters:  08/02/22 124/81  05/11/22 117/85  05/03/22 101/66                          Physical Exam- Limited  Constitutional:  Body mass index is 33.66 kg/m. , not in acute distress, normal state of mind Eyes:  EOMI, no exophthalmos Neck: Supple Cardiovascular: RRR, no murmurs, rubs, or gallops, no edema Respiratory: Adequate breathing efforts, no crackles, rales, rhonchi, or wheezing Musculoskeletal: no gross deformities, strength intact in all four extremities, no gross restriction of joint movements Skin:  no rashes, no hyperemia Neurological: no  tremor, DTR normal in BLE   CMP     Component Value Date/Time   NA 137 05/11/2022 1009   K 4.5 05/11/2022 1009   CL 102 05/11/2022 1009   CO2 23 08/16/2020 1552   GLUCOSE 98 05/11/2022 1009   BUN 7 05/11/2022 1009   BUN 10 03/16/2022 0000   CREATININE 0.80 05/11/2022 1009   CALCIUM 10.0 03/16/2022 0000   PROT 7.5 08/16/2020 1552   ALBUMIN 4.0 08/16/2020 1552   AST 15 08/16/2020 1552   ALT 21 08/16/2020 1552   ALKPHOS 54 08/16/2020 1552   BILITOT 0.4 08/16/2020 1552   GFRNONAA >60 08/16/2020 1552   GFRAA >60 08/16/2020 1552     CBC    Component Value Date/Time  WBC 10.7 (H) 08/16/2020 1552   RBC 4.84 08/16/2020 1552   HGB 15.3 (H) 05/11/2022 1009   HCT 45.0 05/11/2022 1009   PLT 434 (H) 08/16/2020 1552   MCV 83.3 08/16/2020 1552   MCH 27.3 08/16/2020 1552   MCHC 32.8 08/16/2020 1552   RDW 13.0 08/16/2020 1552   LYMPHSABS 4.6 (H) 05/21/2020 0025   MONOABS 0.6 05/21/2020 0025   EOSABS 0.3 05/21/2020 0025   BASOSABS 0.0 05/21/2020 0025     Diabetic Labs (most recent): No results found for: "HGBA1C", "MICROALBUR"  Lipid Panel     Component Value Date/Time   TRIG 94 03/16/2022 0000   LDLCALC 148 03/16/2022 0000     Lab Results  Component Value Date   TSH 0.549 07/21/2022   TSH 0.422 (L) 03/28/2022   TSH 0.36 (A) 03/16/2022   TSH 0.671 06/10/2016   FREET4 1.01 07/21/2022   FREET4 1.07 03/28/2022       Thyroid US from 04/27/22 CLINICAL DATA:  abnormal TSH- mild thyromegaly   EXAM: THYROID ULTRASOUND   TECHNIQUE: Ultrasound examination of the thyroid gland and adjacent soft tissues was performed.   COMPARISON:  None Available.   FINDINGS: Parenchymal Echotexture: Mildly heterogenous   Isthmus: 0.1 cm   Right lobe: 5.0 x 1.2 x 2.1 cm   Left lobe: 4.7 x 1.0 x 2.1 cm   _________________________________________________________   Estimated total number of nodules >/= 1 cm: 1   Number of spongiform nodules >/=  2 cm not described below  (TR1): 0   Number of mixed cystic and solid nodules >/= 1.5 cm not described below (TR2): 0   _________________________________________________________   A single anechoic TR 1 nodule measuring 1.0 cm is present in the inferior aspect of the right hepatic lobe. This nodule does NOT meet TI-RADS criteria for biopsy or dedicated follow-up.   IMPRESSION: Thyroid gland measures near the upper limit of normal. No worrisome thyroid nodules.   The above is in keeping with the ACR TI-RADS recommendations - J Am Coll Radiol 2017;14:587-595.     Electronically Signed   By: Olive Bass M.D.   On: 04/28/2022 07:59   Latest Reference Range & Units 06/10/16 16:34 03/16/22 00:00 03/28/22 15:36 07/21/22 14:13  TSH 0.450 - 4.500 uIU/mL 0.671 0.36 ! (E) 0.422 (L) 0.549  Triiodothyronine,Free,Serum 2.0 - 4.4 pg/mL   3.3 3.6  T4,Free(Direct) 0.82 - 1.77 ng/dL   1.74 9.44  Thyroperoxidase Ab SerPl-aCnc 0 - 34 IU/mL   <9   Thyroglobulin Antibody 0.0 - 0.9 IU/mL   <1.0   !: Data is abnormal (L): Data is abnormally low (E): External lab result  Assessment & Plan:   1. Abnormal TSH- Subclinical Hyperthyroidism  she is being seen at a kind request of Elfredia Nevins, MD.  Her repeat thyroid function tests show euthyroid presentation.  She will not require any antithyroid treatment or thyroid hormone replacement therapy.   Her thyroid ultrasound was mildly heterogeneous with a 1 cm nodule on the posterior side of the right inferior gland, not suspicious for malignancy.  She does not need additional intervention.  2. Adrenal problems?  During her recent ED trip in May, the ED physician expressed his concern for pheochromocytoma but did not complete any work up.  She does have a grandmother with adrenal problems.  Will have her follow up with Dr. Fransico Him regarding this concern.    -Patient is advised to maintain close follow up with Elfredia Nevins, MD for primary care needs.  I spent 34  minutes in the care of the patient today including review of labs from Thyroid Function, CMP, and other relevant labs ; imaging/biopsy records (current and previous including abstractions from other facilities); face-to-face time discussing  her lab results and symptoms, medications doses, her options of short and long term treatment based on the latest standards of care / guidelines;   and documenting the encounter.  Brenda Gilbert  participated in the discussions, expressed understanding, and voiced agreement with the above plans.  All questions were answered to her satisfaction. she is encouraged to contact clinic should she have any questions or concerns prior to her return visit.  Follow up plan: Return in about 2 weeks (around 08/16/2022) for Schedule with Nida for Adrenal work up.   Thank you for involving me in the care of this pleasant patient, and I will continue to update you with her progress.   Rayetta Pigg, [facility] [address] [place], [place] [address] Phone: [phone] Fax: [phone]  08/02/2022, 10:19 AM

## 2022-08-16 ENCOUNTER — Ambulatory Visit: Payer: Medicaid Other | Admitting: "Endocrinology

## 2022-08-24 ENCOUNTER — Encounter: Payer: Self-pay | Admitting: "Endocrinology

## 2022-08-24 ENCOUNTER — Ambulatory Visit (INDEPENDENT_AMBULATORY_CARE_PROVIDER_SITE_OTHER): Payer: Medicaid Other | Admitting: "Endocrinology

## 2022-08-24 VITALS — BP 108/78 | HR 84 | Ht 63.0 in | Wt 183.6 lb

## 2022-08-24 DIAGNOSIS — F17200 Nicotine dependence, unspecified, uncomplicated: Secondary | ICD-10-CM | POA: Insufficient documentation

## 2022-08-24 DIAGNOSIS — E349 Endocrine disorder, unspecified: Secondary | ICD-10-CM

## 2022-08-24 NOTE — Progress Notes (Signed)
08/24/2022     Endocrinology Follow Up Note    Subjective:    Patient ID: Brenda Gilbert, female    DOB: 2001/12/08, PCP Elfredia Nevins, MD.   Past Medical History:  Diagnosis Date   Chronic abdominal pain    Fatigue    Helicobacter pylori (H. pylori) infection    History of kidney stones    Nausea and vomiting    recurrent    Past Surgical History:  Procedure Laterality Date   BIOPSY  05/03/2022   Procedure: BIOPSY;  Surgeon: Corbin Ade, MD;  [facility];  Service: Endoscopy;;   CESAREAN SECTION     03/28/2020 and 12/30/2021   ESOPHAGOGASTRODUODENOSCOPY (EGD) WITH PROPOFOL N/A 05/03/2022   Rourk: normal esophagus s/p dilation and biopsy (benign), normal stomach, normal duodenum   MALONEY DILATION N/A 05/03/2022   Procedure: MALONEY DILATION;  Surgeon: Corbin Ade, MD;  [facility];  Service: Endoscopy;  Laterality: N/A;    Social History   Socioeconomic History   Marital status: Married    Spouse name: Not on file   Number of children: Not on file   Years of education: Not on file   Highest education level: Not on file  Occupational History   Not on file  Tobacco Use   Smoking status: Every Day    Types: E-cigarettes   Smokeless tobacco: Never  Vaping Use   Vaping Use: Every day  Substance and Sexual Activity   Alcohol use: Yes    Comment: rare use   Drug use: No   Sexual activity: Yes  Other Topics Concern   Not on file  Social History Narrative   Not on file   Social Determinants of Health   Financial Resource Strain: Not on file  Food Insecurity: Not on file  Transportation Needs: Not on file  Physical Activity: Not on file  Stress: Not on file  Social Connections: Not on file    Family History  Problem Relation Age of Onset   Cancer Mother    Thyroid disease Mother    Hypertension Mother    Cancer Father    Diabetes Father    Hypertension Father    Barrett's esophagus Father    Esophageal  cancer Father        53 years old   Colon polyps Maternal Grandmother    Cancer - Colon Neg Hx     Outpatient Encounter Medications as of 08/24/2022  Medication Sig   EPINEPHrine (EPIPEN 2-PAK) 0.3 mg/0.3 mL IJ SOAJ injection Inject 0.3 mLs (0.3 mg total) into the muscle as needed for anaphylaxis (for severe allergic reaction with difficulty breathing, tongue or lip swelling, chest pain or shortness of breath).   No facility-administered encounter medications on file as of 08/24/2022.    ALLERGIES: Allergies  Allergen Reactions   Fish Oil Anaphylaxis   Fish-Derived Products Anaphylaxis    seafood    VACCINATION STATUS:  There is no immunization history on file for this patient.   HPI  Brenda Gilbert is 21 y.o. female who presents today with a medical history as above. she was recently seen to assess her thyroid function which was determined to be normal.    PMD:  Elfredia Nevins, MD.  she has been dealing with symptoms of  anxiety, insomnia, and cold/hot temperature fluctuations for 2 years approximately.  -At ER visit previously, there was a concern of pheochromocytoma due to episodic hypertension and headaches.  She is  here today to assess this concern.  She denies any history of hypertension no treatment for hypertension.  She still has intermittent headaches, heat intolerance.  She is not on blood pressure medications.  There is no family history of pheochromocytoma nor any other adrenal dysfunction.   she does have extensive family history of thyroid dysfunction in her mother (hypothyroidism), grandmother (hyperactive) and aunts, but denies family hx of thyroid cancer. she denies personal history of goiter. she is not on any anti-thyroid medications nor on any thyroid hormone supplements. She has been on hair skin and nail vitamins in the past but has not been taking them in quite some time.  Denies use of Biotin containing supplements.    Review of systems  Constitutional:  + stable body weight, current Body mass index is 32.52 kg/m., no fatigue, + hot/cold temperature fluctuations, + intermittent sudden onset headache and high BP- has been seen in ED for this and released Eyes: no blurry vision, no xerophthalmia ENT: no sore throat, no nodules palpated in throat, no dysphagia/odynophagia, no hoarseness Cardiovascular: no chest pain, no shortness of breath, no palpitations, no leg swelling Respiratory: no cough, no shortness of breath Gastrointestinal: no nausea/vomiting/diarrhea Musculoskeletal: no muscle/joint aches Skin: no rashes, no hyperemia Neurological: no tremors, no numbness, no tingling, no dizziness Psychiatric: no depression, + anxiety, + insomnia (has history of PTSD)   Objective:    BP 108/78   Pulse 84   Ht 5\' 3"  (1.6 m)   Wt 183 lb 9.6 oz (83.3 kg)   BMI 32.52 kg/m   Wt Readings from Last 3 Encounters:  08/24/22 183 lb 9.6 oz (83.3 kg)  08/02/22 190 lb (86.2 kg)  05/11/22 194 lb (88 kg)     BP Readings from Last 3 Encounters:  08/24/22 108/78  08/02/22 124/81  05/11/22 117/85                          Physical Exam- Limited  Constitutional:  Body mass index is 32.52 kg/m. , not in acute distress, normal state of mind Eyes:  EOMI, no exophthalmos Neck: Supple Cardiovascular: RRR, no murmurs, rubs, or gallops, no edema Respiratory: Adequate breathing efforts, no crackles, rales, rhonchi, or wheezing Musculoskeletal: no gross deformities, strength intact in all four extremities, no gross restriction of joint movements Skin:  no rashes, no hyperemia Neurological: no tremor, DTR normal in BLE   CMP     Component Value Date/Time   NA 137 05/11/2022 1009   K 4.5 05/11/2022 1009   CL 102 05/11/2022 1009   CO2 23 08/16/2020 1552   GLUCOSE 98 05/11/2022 1009   BUN 7 05/11/2022 1009   BUN 10 03/16/2022 0000   CREATININE 0.80 05/11/2022 1009   CALCIUM 10.0 03/16/2022 0000   PROT 7.5 08/16/2020 1552   ALBUMIN 4.0  08/16/2020 1552   AST 15 08/16/2020 1552   ALT 21 08/16/2020 1552   ALKPHOS 54 08/16/2020 1552   BILITOT 0.4 08/16/2020 1552   GFRNONAA >60 08/16/2020 1552   GFRAA >60 08/16/2020 1552     CBC    Component Value Date/Time   WBC 10.7 (H) 08/16/2020 1552   RBC 4.84 08/16/2020 1552   HGB 15.3 (H) 05/11/2022 1009   HCT 45.0 05/11/2022 1009   PLT 434 (H) 08/16/2020 1552   MCV 83.3 08/16/2020 1552   MCH 27.3 08/16/2020 1552   MCHC 32.8 08/16/2020 1552   RDW 13.0 08/16/2020 1552   LYMPHSABS 4.6 (  H) 05/21/2020 0025   MONOABS 0.6 05/21/2020 0025   EOSABS 0.3 05/21/2020 0025   BASOSABS 0.0 05/21/2020 0025     Diabetic Labs (most recent): No results found for: "HGBA1C", "MICROALBUR"  Lipid Panel     Component Value Date/Time   TRIG 94 03/16/2022 0000   LDLCALC 148 03/16/2022 0000     Lab Results  Component Value Date   TSH 0.549 07/21/2022   TSH 0.422 (L) 03/28/2022   TSH 0.36 (A) 03/16/2022   TSH 0.671 06/10/2016   FREET4 1.01 07/21/2022   FREET4 1.07 03/28/2022       Thyroid US from 04/27/22 CLINICAL DATA:  abnormal TSH- mild thyromegaly   EXAM: THYROID ULTRASOUND   TECHNIQUE: Ultrasound examination of the thyroid gland and adjacent soft tissues was performed.   COMPARISON:  None Available.   FINDINGS: Parenchymal Echotexture: Mildly heterogenous   Isthmus: 0.1 cm   Right lobe: 5.0 x 1.2 x 2.1 cm   Left lobe: 4.7 x 1.0 x 2.1 cm   _________________________________________________________   Estimated total number of nodules >/= 1 cm: 1   Number of spongiform nodules >/=  2 cm not described below (TR1): 0   Number of mixed cystic and solid nodules >/= 1.5 cm not described below (TR2): 0   _________________________________________________________   A single anechoic TR 1 nodule measuring 1.0 cm is present in the inferior aspect of the right hepatic lobe. This nodule does NOT meet TI-RADS criteria for biopsy or dedicated follow-up.    IMPRESSION: Thyroid gland measures near the upper limit of normal. No worrisome thyroid nodules.   The above is in keeping with the ACR TI-RADS recommendations - J Am Coll Radiol 2017;14:587-595.     Electronically Signed   By: Albin Felling M.D.   On: 04/28/2022 07:59   Latest Reference Range & Units 06/10/16 16:34 03/16/22 00:00 03/28/22 15:36 07/21/22 14:13  TSH 0.450 - 4.500 uIU/mL 0.671 0.36 ! (E) 0.422 (L) 0.549  Triiodothyronine,Free,Serum 2.0 - 4.4 pg/mL   3.3 3.6  T4,Free(Direct) 0.82 - 1.77 ng/dL   1.07 1.01  Thyroperoxidase Ab SerPl-aCnc 0 - 34 IU/mL   <9   Thyroglobulin Antibody 0.0 - 0.9 IU/mL   <1.0   !: Data is abnormal (L): Data is abnormally low (E): External lab result  Assessment & Plan:    Adrenal problems I reviewed her medical records, did not find convincing evidence of pheochromocytoma. During her recent ED trip in May, the ED physician expressed his concern for pheochromocytoma .  Pretest likelihood of pheochromocytoma is low in this patient.  However, she will be offered plasma metanephrines to assess before her next visit.  If she is found to have evidence of adrenal hyperfunction, she will be considered for imaging studies  The patient was counseled on the dangers of tobacco use, and was advised to quit.  Reviewed strategies to maximize success, including removing cigarettes and smoking materials from environment.   2. Abnormal TSH- Subclinical Hyperthyroidism  Her repeat thyroid function tests show euthyroid presentation.  She will not require any antithyroid treatment or thyroid hormone replacement therapy.   Her thyroid ultrasound was mildly heterogeneous with a 1 cm nodule on the posterior side of the right inferior gland, not suspicious for malignancy.  She does not need additional intervention.    -Patient is advised to maintain close follow up with Redmond School, MD for primary care needs.   I spent 31 minutes in the care of the patient  today including  review of labs from Thyroid Function, CMP, and other relevant labs ; imaging/biopsy records (current and previous including abstractions from other facilities); face-to-face time discussing  her lab results and symptoms, medications doses, her options of short and long term treatment based on the latest standards of care / guidelines;   and documenting the encounter.  Deyanira Darwin  participated in the discussions, expressed understanding, and voiced agreement with the above plans.  All questions were answered to her satisfaction. she is encouraged to contact clinic should she have any questions or concerns prior to her return visit.   Follow up plan: Return in about 8 days (around 09/01/2022) for F/U with Pre-visit Labs.   Thank you for involving me in the care of this pleasant patient, and I will continue to update you with her progress.   Rayetta Pigg, [facility] [address] [place], [place] [address] Phone: [phone] Fax: [phone]  08/24/2022, 3:24 PM

## 2022-08-31 ENCOUNTER — Ambulatory Visit: Payer: Medicaid Other | Admitting: "Endocrinology

## 2022-09-01 ENCOUNTER — Ambulatory Visit: Payer: Medicaid Other | Admitting: "Endocrinology

## 2022-09-04 LAB — METANEPHRINES, PLASMA
Metanephrine, Free: 36.1 pg/mL (ref 0.0–88.0)
Normetanephrine, Free: 73.4 pg/mL (ref 0.0–210.1)

## 2022-09-12 ENCOUNTER — Encounter: Payer: Self-pay | Admitting: "Endocrinology

## 2022-09-12 ENCOUNTER — Ambulatory Visit (INDEPENDENT_AMBULATORY_CARE_PROVIDER_SITE_OTHER): Payer: Medicaid Other | Admitting: "Endocrinology

## 2022-09-12 VITALS — BP 100/68 | HR 88 | Ht 63.0 in | Wt 180.8 lb

## 2022-09-12 DIAGNOSIS — E041 Nontoxic single thyroid nodule: Secondary | ICD-10-CM | POA: Diagnosis not present

## 2022-09-12 DIAGNOSIS — E349 Endocrine disorder, unspecified: Secondary | ICD-10-CM

## 2022-09-12 NOTE — Progress Notes (Signed)
09/12/2022     Endocrinology Follow Up Note    Subjective:    Patient ID: Brenda Gilbert, female    DOB: 2001/07/30, PCP Redmond School, MD.   Past Medical History:  Diagnosis Date   Chronic abdominal pain    Fatigue    Helicobacter pylori (H. pylori) infection    History of kidney stones    Nausea and vomiting    recurrent    Past Surgical History:  Procedure Laterality Date   BIOPSY  05/03/2022   Procedure: BIOPSY;  Surgeon: Daneil Dolin, MD;  [facility];  Service: Endoscopy;;   CESAREAN SECTION     03/28/2020 and 12/30/2021   ESOPHAGOGASTRODUODENOSCOPY (EGD) WITH PROPOFOL N/A 05/03/2022   Rourk: normal esophagus s/p dilation and biopsy (benign), normal stomach, normal duodenum   MALONEY DILATION N/A 05/03/2022   Procedure: MALONEY DILATION;  Surgeon: Daneil Dolin, MD;  [facility];  Service: Endoscopy;  Laterality: N/A;    Social History   Socioeconomic History   Marital status: Married    Spouse name: Not on file   Number of children: Not on file   Years of education: Not on file   Highest education level: Not on file  Occupational History   Not on file  Tobacco Use   Smoking status: Every Day    Types: E-cigarettes   Smokeless tobacco: Never  Vaping Use   Vaping Use: Every day  Substance and Sexual Activity   Alcohol use: Yes    Comment: rare use   Drug use: No   Sexual activity: Yes  Other Topics Concern   Not on file  Social History Narrative   Not on file   Social Determinants of Health   Financial Resource Strain: Not on file  Food Insecurity: Not on file  Transportation Needs: Not on file  Physical Activity: Not on file  Stress: Not on file  Social Connections: Not on file    Family History  Problem Relation Age of Onset   Cancer Mother    Thyroid disease Mother    Hypertension Mother    Cancer Father    Diabetes Father    Hypertension Father    Barrett's esophagus Father    Esophageal  cancer Father        53 years old   Colon polyps Maternal Grandmother    Cancer - Colon Neg Hx     Outpatient Encounter Medications as of 09/12/2022  Medication Sig   EPINEPHrine (EPIPEN 2-PAK) 0.3 mg/0.3 mL IJ SOAJ injection Inject 0.3 mLs (0.3 mg total) into the muscle as needed for anaphylaxis (for severe allergic reaction with difficulty breathing, tongue or lip swelling, chest pain or shortness of breath).   No facility-administered encounter medications on file as of 09/12/2022.    ALLERGIES: Allergies  Allergen Reactions   Fish Oil Anaphylaxis   Fish-Derived Products Anaphylaxis    seafood    VACCINATION STATUS:  There is no immunization history on file for this patient.   HPI  Brenda Gilbert is 21 y.o. female who presents today with a medical history as above. she was recently seen to assess for possible pheochromocytoma.  She was recently worked up for thyroid function which was normal.    she has been dealing with symptoms of  anxiety, insomnia, and cold/hot temperature fluctuations for 2 years approximately.  -At ER visit previously, there was a concern of pheochromocytoma due to episodic hypertension and headaches.  She is  here today to assess this concern.  She denies any history of hypertension no treatment for hypertension.  She still has intermittent headaches, heat intolerance.  She is not on blood pressure medications.  There is no family history of pheochromocytoma nor any other adrenal dysfunction.  Her previsit lab work rules out pheochromocytoma.   she does have extensive family history of thyroid dysfunction in her mother (hypothyroidism), grandmother (hyperactive) and aunts, but denies family hx of thyroid cancer. she denies personal history of goiter. she is not on any anti-thyroid medications nor on any thyroid hormone supplements. She has been on hair skin and nail vitamins in the past but has not been taking them in quite some time.  Denies use of Biotin  containing supplements.    Review of systems  Constitutional: + stable body weight, current Body mass index is 32.03 kg/m., no fatigue, + hot/cold temperature fluctuations, + intermittent sudden onset headache and high BP- has been seen in ED for this and released Eyes: no blurry vision, no xerophthalmia ENT: no sore throat, no nodules palpated in throat, no dysphagia/odynophagia, no hoarseness Cardiovascular: no chest pain, no shortness of breath, no palpitations, no leg swelling Respiratory: no cough, no shortness of breath Gastrointestinal: no nausea/vomiting/diarrhea Musculoskeletal: no muscle/joint aches Skin: no rashes, no hyperemia Neurological: no tremors, no numbness, no tingling, no dizziness Psychiatric: no depression, + anxiety, + insomnia (has history of PTSD)   Objective:    BP 100/68   Pulse 88   Ht 5\' 3"  (1.6 m)   Wt 180 lb 12.8 oz (82 kg)   BMI 32.03 kg/m   Wt Readings from Last 3 Encounters:  09/12/22 180 lb 12.8 oz (82 kg)  08/24/22 183 lb 9.6 oz (83.3 kg)  08/02/22 190 lb (86.2 kg)     BP Readings from Last 3 Encounters:  09/12/22 100/68  08/24/22 108/78  08/02/22 124/81                          Physical Exam- Limited     CMP     Component Value Date/Time   NA 137 05/11/2022 1009   K 4.5 05/11/2022 1009   CL 102 05/11/2022 1009   CO2 23 08/16/2020 1552   GLUCOSE 98 05/11/2022 1009   BUN 7 05/11/2022 1009   BUN 10 03/16/2022 0000   CREATININE 0.80 05/11/2022 1009   CALCIUM 10.0 03/16/2022 0000   PROT 7.5 08/16/2020 1552   ALBUMIN 4.0 08/16/2020 1552   AST 15 08/16/2020 1552   ALT 21 08/16/2020 1552   ALKPHOS 54 08/16/2020 1552   BILITOT 0.4 08/16/2020 1552   GFRNONAA >60 08/16/2020 1552   GFRAA >60 08/16/2020 1552     CBC    Component Value Date/Time   WBC 10.7 (H) 08/16/2020 1552   RBC 4.84 08/16/2020 1552   HGB 15.3 (H) 05/11/2022 1009   HCT 45.0 05/11/2022 1009   PLT 434 (H) 08/16/2020 1552   MCV 83.3 08/16/2020 1552    MCH 27.3 08/16/2020 1552   MCHC 32.8 08/16/2020 1552   RDW 13.0 08/16/2020 1552   LYMPHSABS 4.6 (H) 05/21/2020 0025   MONOABS 0.6 05/21/2020 0025   EOSABS 0.3 05/21/2020 0025   BASOSABS 0.0 05/21/2020 0025      Lipid Panel     Component Value Date/Time   TRIG 94 03/16/2022 0000   LDLCALC 148 03/16/2022 0000     Lab Results  Component Value Date   TSH 0.549  07/21/2022   TSH 0.422 (L) 03/28/2022   TSH 0.36 (A) 03/16/2022   TSH 0.671 06/10/2016   FREET4 1.01 07/21/2022   FREET4 1.07 03/28/2022    Recent Results (from the past 2160 hour(s))  TSH     Status: None   Collection Time: 07/21/22  2:13 PM  Result Value Ref Range   TSH 0.549 0.450 - 4.500 uIU/mL  T4, free     Status: None   Collection Time: 07/21/22  2:13 PM  Result Value Ref Range   Free T4 1.01 0.82 - 1.77 ng/dL  T3, free     Status: None   Collection Time: 07/21/22  2:13 PM  Result Value Ref Range   T3, Free 3.6 2.0 - 4.4 pg/mL  Metanephrines, plasma     Status: None   Collection Time: 08/24/22  1:49 PM  Result Value Ref Range   Normetanephrine, Free 73.4 0.0 - 210.1 pg/mL   Metanephrine, Free 36.1 0.0 - 88.0 pg/mL     Latest Reference Range & Units 08/24/22 13:49  Metanephrine, Pl 0.0 - 88.0 pg/mL 36.1  Normetanephrine, Pl 0.0 - 210.1 pg/mL 73.4    Thyroid US from 04/27/22 CLINICAL DATA:  abnormal TSH- mild thyromegaly   EXAM: THYROID ULTRASOUND   TECHNIQUE: Ultrasound examination of the thyroid gland and adjacent soft tissues was performed.   COMPARISON:  None Available.   FINDINGS: Parenchymal Echotexture: Mildly heterogenous   Isthmus: 0.1 cm   Right lobe: 5.0 x 1.2 x 2.1 cm   Left lobe: 4.7 x 1.0 x 2.1 cm   _________________________________________________________   Estimated total number of nodules >/= 1 cm: 1   Number of spongiform nodules >/=  2 cm not described below (TR1): 0   Number of mixed cystic and solid nodules >/= 1.5 cm not described below (TR2): 0    _________________________________________________________   A single anechoic TR 1 nodule measuring 1.0 cm is present in the inferior aspect of the right thyroid lobe. This nodule does NOT meet TI-RADS criteria for biopsy or dedicated follow-up.   IMPRESSION: Thyroid gland measures near the upper limit of normal. No worrisome thyroid nodules.   The above is in keeping with the ACR TI-RADS recommendations - J Am Coll Radiol 2017;14:587-595.     Electronically Signed   By: Albin Felling M.D.   On: 04/28/2022 07:59   Latest Reference Range & Units 06/10/16 16:34 03/16/22 00:00 03/28/22 15:36 07/21/22 14:13  TSH 0.450 - 4.500 uIU/mL 0.671 0.36 ! (E) 0.422 (L) 0.549  Triiodothyronine,Free,Serum 2.0 - 4.4 pg/mL   3.3 3.6  T4,Free(Direct) 0.82 - 1.77 ng/dL   1.07 1.01  Thyroperoxidase Ab SerPl-aCnc 0 - 34 IU/mL   <9   Thyroglobulin Antibody 0.0 - 0.9 IU/mL   <1.0   !: Data is abnormal (L): Data is abnormally low (E): External lab result  Assessment & Plan:   Thyroid nodule I reviewed her medical records, did not find convincing evidence of pheochromocytoma.  Her previsit free plasma metanephrines are normal.  She will not need further intervention.   She will need repeat thyroid function test and thyroid ultrasound in a year.  The patient was counseled on the dangers of tobacco use, and was advised to quit.  Reviewed strategies to maximize success, including removing cigarettes and smoking materials from environment.  Her thyroid ultrasound was mildly heterogeneous with a 1 cm nodule on the posterior side of the right inferior gland, not suspicious for malignancy.  She does not need additional  intervention.   -Patient is advised to maintain close follow up with Redmond School, MD for primary care needs.  I spent 22 minutes in the care of the patient today including review of labs from Thyroid Function, CMP, and other relevant labs ; imaging/biopsy records (current and previous  including abstractions from other facilities); face-to-face time discussing  her lab results and symptoms, medications doses, her options of short and long term treatment based on the latest standards of care / guidelines;   and documenting the encounter.  Demetrius Cammarano  participated in the discussions, expressed understanding, and voiced agreement with the above plans.  All questions were answered to her satisfaction. she is encouraged to contact clinic should she have any questions or concerns prior to her return visit.    Follow up plan: Return in about 1 year (around 09/13/2023) for Thyroid / Neck Ultrasound, F/U with Pre-visit Labs.   Thank you for involving me in the care of this pleasant patient, and I will continue to update you with her progress.   Rayetta Pigg, [facility] [address] [place], [place] [address] Phone: [phone] Fax: [phone]  09/12/2022, 6:14 PM

## 2023-04-06 ENCOUNTER — Ambulatory Visit
Admission: EM | Admit: 2023-04-06 | Discharge: 2023-04-06 | Disposition: A | Discharge status: Home / Self Care | Payer: Medicaid Other | Attending: Nurse Practitioner | Admitting: Nurse Practitioner

## 2023-04-06 ENCOUNTER — Other Ambulatory Visit: Payer: Self-pay

## 2023-04-06 ENCOUNTER — Encounter: Payer: Self-pay | Admitting: Emergency Medicine

## 2023-04-06 ENCOUNTER — Ambulatory Visit (INDEPENDENT_AMBULATORY_CARE_PROVIDER_SITE_OTHER): Payer: Medicaid Other

## 2023-04-06 DIAGNOSIS — Y9371 Activity, boxing: Secondary | ICD-10-CM

## 2023-04-06 DIAGNOSIS — R202 Paresthesia of skin: Secondary | ICD-10-CM

## 2023-04-06 DIAGNOSIS — M79641 Pain in right hand: Secondary | ICD-10-CM

## 2023-04-06 MED ORDER — PREDNISONE 20 MG PO TABS: 40.0000 mg | ORAL_TABLET | Freq: Every day | ORAL | 0 refills | Status: AC

## 2023-04-06 NOTE — Discharge Instructions (Addendum)
The x-ray of the right hand is negative for fracture or dislocation. Take medication as prescribed. May apply ice to help with pain or swelling.  Apply for 20 minutes, remove for 1 hour, then repeat is much as possible. May take over-the-counter Tylenol as needed for pain or discomfort. Gentle stretching and range of motion exercises with the right hand to help decrease symptoms. If your symptoms do not improve with this treatment or if symptoms return after completing this treatment, it is recommended we will just look at it and be allowing him not going to do an x-ray because of not getting give exposed 22-year-old to radiation for some stupid like it but if it continues to be swollen or something like that if she cannot tell me like a specific trauma," that you follow-up with your primary care physician or with orthopedics for further evaluation.  We can follow-up with [facility] at [phone] with [facility] at [phone]. Follow-up as needed.

## 2023-04-06 NOTE — ED Triage Notes (Addendum)
Pt reports right hand pain since Wednesday. Pt reports intermittent bruising, increased pain with movement, right thumb "feels numb" and reports intermittent radiation of pain up to right elbow. Denies any known injury but reports did have jujitsu and kickboxing Tuesday night and reports "something could have happened there." No obvious deformity noted.

## 2023-04-06 NOTE — ED Provider Notes (Signed)
[facility]    CSN: [id number] Arrival date & time: 04/06/23  1303      History   Chief Complaint Chief Complaint  Patient presents with   Hand Pain    HPI Brenda Gilbert is a 22 y.o. female.   The history is provided by the patient.   The patient presents for complaints of right hand pain that started over the past 2 to 3 days.  Patient states the day prior to her symptoms starting, she participated in jujitsu, and was rolling around on the mat.  She states since that time, she noticed swelling to the top of her hand next to her thumb.  She states that she also has numbness and right hand.  She states that she does have intermittent "shooting pain" that goes from her hand up to her elbow.  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  She states that she has been doing this job for the past 4 months.  She states that she has noticed that the swelling in her hand has improved.  She has not taken any medication for her symptoms.  Denies any previous injury or trauma. Past Medical History:  Diagnosis Date   Chronic abdominal pain    Fatigue    Helicobacter pylori (H. pylori) infection    History of kidney stones    Nausea and vomiting    recurrent    Patient Active Problem List   Diagnosis Date Noted   Endocrine disorder, unspecified 08/24/2022   Current smoker 08/24/2022    Past Surgical History:  Procedure Laterality Date   BIOPSY  05/03/2022   Procedure: BIOPSY;  Surgeon: Corbin Ade, MD;  [facility];  Service: Endoscopy;;   CESAREAN SECTION     03/28/2020 and 12/30/2021   ESOPHAGOGASTRODUODENOSCOPY (EGD) WITH PROPOFOL N/A 05/03/2022   Rourk: normal esophagus s/p dilation and biopsy (benign), normal stomach, normal duodenum   MALONEY DILATION N/A 05/03/2022   Procedure: MALONEY DILATION;  Surgeon: Corbin Ade, MD;  [facility];  Service:  Endoscopy;  Laterality: N/A;    OB History     Gravida      Para      Term      Preterm      AB      Living  0      SAB      IAB      Ectopic      Multiple      Live Births               Home Medications    Prior to Admission medications   Medication Sig Start Date End Date Taking? Authorizing Provider  predniSONE (DELTASONE) 20 MG tablet Take 2 tablets (40 mg total) by mouth daily with breakfast for 5 days. 04/06/23 04/11/23 Yes Eastin Swing-Warren, Sadie Haber, NP  EPINEPHrine (EPIPEN 2-PAK) 0.3 mg/0.3 mL IJ SOAJ injection Inject 0.3 mLs (0.3 mg total) into the muscle as needed for anaphylaxis (for severe allergic reaction with difficulty breathing, tongue or lip swelling, chest pain or shortness of breath). 05/21/20   Glynn Octave, MD    Family History Family History  Problem Relation Age of Onset   Cancer Mother    Thyroid disease Mother    Hypertension Mother    Cancer Father    Diabetes Father    Hypertension Father  Barrett's esophagus Father    Esophageal cancer Father        66 years old   Colon polyps Maternal Grandmother    Cancer - Colon Neg Hx     Social History Social History   Tobacco Use   Smoking status: Every Day    Types: E-cigarettes   Smokeless tobacco: Never  Vaping Use   Vaping Use: Every day  Substance Use Topics   Alcohol use: Yes    Comment: rare use   Drug use: No     Allergies   Fish oil and Fish-derived products   Review of Systems Review of Systems Per HPI  Physical Exam Triage Vital Signs ED Triage Vitals  Enc Vitals Group     BP 04/06/23 1344 113/78     Pulse Rate 04/06/23 1344 89     Resp 04/06/23 1344 20     Temp 04/06/23 1344 97.7 F (36.5 C)     Temp Source 04/06/23 1344 Oral     SpO2 04/06/23 1344 94 %     Weight --      Height --      Head Circumference --      Peak Flow --      Pain Score 04/06/23 1342 4     Pain Loc --      Pain Edu? --      Excl. in GC? --    No data  found.  Updated Vital Signs BP 113/78 (BP Location: Right Arm)   Pulse 89   Temp 97.7 F (36.5 C) (Oral)   Resp 20   LMP 03/12/2023 (Approximate)   SpO2 94%   Visual Acuity Right Eye Distance:   Left Eye Distance:   Bilateral Distance:    Right Eye Near:   Left Eye Near:    Bilateral Near:     Physical Exam Vitals and nursing note reviewed.  Constitutional:      Appearance: Normal appearance. She is not toxic-appearing.  Eyes:     Pupils: Pupils are equal, round, and reactive to light.  Pulmonary:     Effort: Pulmonary effort is normal.  Musculoskeletal:     Right hand: Tenderness present. Normal range of motion. Normal capillary refill. Normal pulse.  Skin:    General: Skin is warm and dry.  Neurological:     General: No focal deficit present.     Mental Status: She is alert and oriented to person, place, and time.  Psychiatric:        Mood and Affect: Mood normal.        Behavior: Behavior normal.      UC Treatments / Results  Labs (all labs ordered are listed, but only abnormal results are displayed) Labs Reviewed - No data to display  EKG   Radiology DG Hand Complete Right  Result Date: 04/06/2023 CLINICAL DATA:  Right hand pain after kick boxing class 3 days ago. Pancreas and the second metacarpal. EXAM: RIGHT HAND - COMPLETE 3+ VIEW COMPARISON:  None Available. FINDINGS: Normal bone mineralization. Joint spaces are preserved. Neutral ulnar variance.No acute fracture or dislocation. No radiopaque foreign body. IMPRESSION: No acute fracture. Electronically Signed   By: Neita Garnet M.D.   On: 04/06/2023 14:14    Procedures Procedures (including critical care time)  Medications Ordered in UC Medications - No data to display  Initial Impression / Assessment and Plan / UC Course  I have reviewed the triage vital signs and the nursing notes.  Pertinent  labs & imaging results that were available during my care of the patient were reviewed by me and  considered in my medical decision making (see chart for details).  The patient is well-appearing, she is in no acute distress, vital signs are stable.  Difficult to ascertain the cause of the patient's symptoms.  There is no obvious injury or trauma, x-ray is negative for fracture or dislocation.  Given the patient's complaint of numbness, tingling, and radiation of pain, symptoms appear to be neuropathic in nature.  Will start patient on prednisone 40 mg for the next 5 days to see if this helps with her symptoms.  Patient was given supportive care recommendations to include over-the-counter Tylenol for pain or discomfort, the use of ice to help with pain or swelling, and gentle stretching and range of motion exercises of the right hand.  Patient was advised that if symptoms do not improve with this treatment, or if they improve and return, recommend that she follow-up with orthopedics for further evaluation.  Patient is in agreement with this plan of care and verbalizes understanding.  All questions were answered.  Patient stable for discharge.  Work note was provided.  Final Clinical Impressions(s) / UC Diagnoses   Final diagnoses:  Right hand paresthesia  Right hand pain     Discharge Instructions      The x-ray of the right hand is negative for fracture or dislocation. Take medication as prescribed. May apply ice to help with pain or swelling.  Apply for 20 minutes, remove for 1 hour, then repeat is much as possible. May take over-the-counter Tylenol as needed for pain or discomfort. Gentle stretching and range of motion exercises with the right hand to help decrease symptoms. If your symptoms do not improve with this treatment or if symptoms return after completing this treatment, it is recommended we will just look at it and be allowing him not going to do an x-ray because of not getting give exposed 4-year-old to radiation for some stupid like it but if it continues to be swollen or  something like that if she cannot tell me like a specific trauma," that you follow-up with your primary care physician or with orthopedics for further evaluation.  We can follow-up with [facility] at [phone] with [facility] at [phone]. Follow-up as needed.     ED Prescriptions     Medication Sig Dispense Auth. Provider   predniSONE (DELTASONE) 20 MG tablet Take 2 tablets (40 mg total) by mouth daily with breakfast for 5 days. 10 tablet Arlester Keehan-Warren, Sadie Haber, NP      PDMP not reviewed this encounter.   Abran Cantor, NP 04/06/23 1437

## 2023-09-04 ENCOUNTER — Other Ambulatory Visit: Payer: Self-pay

## 2023-09-04 ENCOUNTER — Other Ambulatory Visit: Payer: Self-pay | Admitting: Nurse Practitioner

## 2023-09-04 DIAGNOSIS — E0590 Thyrotoxicosis, unspecified without thyrotoxic crisis or storm: Secondary | ICD-10-CM

## 2023-09-10 ENCOUNTER — Ambulatory Visit (HOSPITAL_COMMUNITY)
Admission: RE | Admit: 2023-09-10 | Discharge: 2023-09-10 | Disposition: A | Discharge status: Home / Self Care | Payer: Medicaid Other | Source: Ambulatory Visit | Attending: "Endocrinology | Admitting: "Endocrinology

## 2023-09-10 DIAGNOSIS — E041 Nontoxic single thyroid nodule: Secondary | ICD-10-CM | POA: Diagnosis present

## 2023-09-13 ENCOUNTER — Ambulatory Visit: Payer: Medicaid Other | Admitting: "Endocrinology

## 2023-09-25 ENCOUNTER — Ambulatory Visit (INDEPENDENT_AMBULATORY_CARE_PROVIDER_SITE_OTHER): Payer: Medicaid Other | Admitting: "Endocrinology

## 2023-09-25 ENCOUNTER — Encounter: Payer: Self-pay | Admitting: "Endocrinology

## 2023-09-25 VITALS — BP 96/64 | HR 88 | Ht 63.0 in | Wt 145.6 lb

## 2023-09-25 DIAGNOSIS — E041 Nontoxic single thyroid nodule: Secondary | ICD-10-CM

## 2023-09-25 NOTE — Progress Notes (Signed)
09/25/2023     Endocrinology Follow Up Note    Subjective:    Patient ID: Brenda Gilbert, female    DOB: 2001-08-18, PCP Elfredia Nevins, MD.   Past Medical History:  Diagnosis Date   Chronic abdominal pain    Fatigue    Helicobacter pylori (H. pylori) infection    History of kidney stones    Nausea and vomiting    recurrent    Past Surgical History:  Procedure Laterality Date   BIOPSY  05/03/2022   Procedure: BIOPSY;  Surgeon: Corbin Ade, MD;  [facility];  Service: Endoscopy;;   CESAREAN SECTION     03/28/2020 and 12/30/2021   ESOPHAGOGASTRODUODENOSCOPY (EGD) WITH PROPOFOL N/A 05/03/2022   Rourk: normal esophagus s/p dilation and biopsy (benign), normal stomach, normal duodenum   MALONEY DILATION N/A 05/03/2022   Procedure: MALONEY DILATION;  Surgeon: Corbin Ade, MD;  [facility]: [facility];  Service: Endoscopy;  Laterality: N/A;    Social History   Socioeconomic History   Marital status: Married    Spouse name: Not on file   Number of children: Not on file   Years of education: Not on file   Highest education level: Not on file  Occupational History   Not on file  Tobacco Use   Smoking status: Every Day    Types: E-cigarettes   Smokeless tobacco: Never  Vaping Use   Vaping status: Every Day  Substance and Sexual Activity   Alcohol use: Yes    Comment: rare use   Drug use: No   Sexual activity: Yes    Birth control/protection: None  Other Topics Concern   Not on file  Social History Narrative   Not on file   Social Determinants of Health   Financial Resource Strain: Not on file  Food Insecurity: No Food Insecurity (08/25/2019)   Received from [facility], [facility]   Hunger Vital Sign    Worried About Running Out of Food in the Last Year: Never true    Ran Out of Food in the Last Year: Never true  Transportation Needs: No Transportation Needs (08/25/2019)   Received from [facility], [facility]    [facility] - Transportation    Lack of Transportation (Medical): No    Lack of Transportation (Non-Medical): No  Physical Activity: Not on file  Stress: No Stress Concern Present (08/25/2019)   Received from [facility], [facility]   [facility] - Occupational Stress Questionnaire    Feeling of Stress : Not at all  Social Connections: Unknown (05/24/2023)   Received from [facility]   Social Network    Social Network: Not on file    Family History  Problem Relation Age of Onset   Cancer Mother    Thyroid disease Mother    Hypertension Mother    Cancer Father    Diabetes Father    Hypertension Father    Barrett's esophagus Father    Esophageal cancer Father        59 years old   Colon polyps Maternal Grandmother    Cancer - Colon Neg Hx     Outpatient Encounter Medications as of 09/25/2023  Medication Sig   VRAYLAR 1.5 MG capsule Take 1.5 mg by mouth.   EPINEPHrine (EPIPEN 2-PAK) 0.3 mg/0.3 mL IJ SOAJ injection Inject 0.3 mLs (0.3 mg total) into the muscle as needed for anaphylaxis (for severe allergic reaction  with difficulty breathing, tongue or lip swelling, chest pain or shortness of breath).   No facility-administered encounter medications on file as of 09/25/2023.    ALLERGIES: Allergies  Allergen Reactions   Fish Oil Anaphylaxis   Fish-Derived Products Anaphylaxis    seafood    VACCINATION STATUS:  There is no immunization history on file for this patient.   HPI  Brenda Gilbert is 22 y.o. female who presents today with a medical history as above. she was previously seen to rule out pheochromocytoma.  Her workup was negative.  She did have mild goiter, returns with dedicated thyroid ultrasound and repeat thyroid function tests.    She has no new complaints.  She is engaged in intensive exercise program which allowed her to lose significant weight since last visit.    There is no family history of pheochromocytoma nor any  other adrenal dysfunction. She has no family history of thyroid  malignancy. Her previsit lab work rules out pheochromocytoma.   she does have extensive family history of thyroid dysfunction in her mother (hypothyroidism), grandmother (hyperactive) and aunts, but denies family hx of thyroid cancer. she denies personal history of goiter. she is not on any anti-thyroid medications nor on any thyroid hormone supplements. She has been on hair skin and nail vitamins in the past but has not been taking them in quite some time.  Denies use of Biotin containing supplements.    Review of systems  Constitutional: + Intentional weight loss 35 pounds since last visit.   Objective:    BP 96/64   Pulse 88   Ht 5\' 3"  (1.6 m)   Wt 145 lb 9.6 oz (66 kg)   BMI 25.79 kg/m   Wt Readings from Last 3 Encounters:  09/25/23 145 lb 9.6 oz (66 kg)  09/12/22 180 lb 12.8 oz (82 kg)  08/24/22 183 lb 9.6 oz (83.3 kg)     BP Readings from Last 3 Encounters:  09/25/23 96/64  04/06/23 113/78  09/12/22 100/68                          Physical Exam- Limited  Palpable thyroid   CMP     Component Value Date/Time   NA 137 05/11/2022 1009   K 4.5 05/11/2022 1009   CL 102 05/11/2022 1009   CO2 23 08/16/2020 1552   GLUCOSE 98 05/11/2022 1009   BUN 7 05/11/2022 1009   BUN 10 03/16/2022 0000   CREATININE 0.80 05/11/2022 1009   CALCIUM 10.0 03/16/2022 0000   PROT 7.5 08/16/2020 1552   ALBUMIN 4.0 08/16/2020 1552   AST 15 08/16/2020 1552   ALT 21 08/16/2020 1552   ALKPHOS 54 08/16/2020 1552   BILITOT 0.4 08/16/2020 1552   GFRNONAA >60 08/16/2020 1552   GFRAA >60 08/16/2020 1552     CBC    Component Value Date/Time   WBC 10.7 (H) 08/16/2020 1552   RBC 4.84 08/16/2020 1552   HGB 15.3 (H) 05/11/2022 1009   HCT 45.0 05/11/2022 1009   PLT 434 (H) 08/16/2020 1552   MCV 83.3 08/16/2020 1552   MCH 27.3 08/16/2020 1552   MCHC 32.8 08/16/2020 1552   RDW 13.0 08/16/2020 1552   LYMPHSABS 4.6 (H)  05/21/2020 0025   MONOABS 0.6 05/21/2020 0025   EOSABS 0.3 05/21/2020 0025   BASOSABS 0.0 05/21/2020 0025      Lipid Panel     Component Value Date/Time   TRIG 94 03/16/2022 0000  LDLCALC 148 03/16/2022 0000     Lab Results  Component Value Date   TSH 1.150 09/10/2023   TSH 0.549 07/21/2022   TSH 0.422 (L) 03/28/2022   TSH 0.36 (A) 03/16/2022   TSH 0.671 06/10/2016   FREET4 1.33 09/10/2023   FREET4 1.01 07/21/2022   FREET4 1.07 03/28/2022    Recent Results (from the past 2160 hour(s))  TSH     Status: None   Collection Time: 09/10/23 10:23 AM  Result Value Ref Range   TSH 1.150 0.450 - 4.500 uIU/mL  T4, free     Status: None   Collection Time: 09/10/23 10:23 AM  Result Value Ref Range   Free T4 1.33 0.82 - 1.77 ng/dL     Latest Reference Range & Units 08/24/22 13:49  Metanephrine, Pl 0.0 - 88.0 pg/mL 36.1  Normetanephrine, Pl 0.0 - 210.1 pg/mL 73.4    Thyroid US from 04/27/22 CLINICAL DATA:  abnormal TSH- mild thyromegaly   EXAM: THYROID ULTRASOUND   TECHNIQUE: Ultrasound examination of the thyroid gland and adjacent soft tissues was performed.   COMPARISON:  None Available.   FINDINGS: Parenchymal Echotexture: Mildly heterogenous   Isthmus: 0.1 cm   Right lobe: 5.0 x 1.2 x 2.1 cm   Left lobe: 4.7 x 1.0 x 2.1 cm   _________________________________________________________   Estimated total number of nodules >/= 1 cm: 1   Number of spongiform nodules >/=  2 cm not described below (TR1): 0   Number of mixed cystic and solid nodules >/= 1.5 cm not described below (TR2): 0   _________________________________________________________   A single anechoic TR 1 nodule measuring 1.0 cm is present in the inferior aspect of the right thyroid lobe. This nodule does NOT meet TI-RADS criteria for biopsy or dedicated follow-up.   IMPRESSION: Thyroid gland measures near the upper limit of normal. No worrisome thyroid nodules.   The above is in keeping  with the ACR TI-RADS recommendations - J Am Coll Radiol 2017;14:587-595.     Electronically Signed   By: Olive Bass M.D.   On: 04/28/2022 07:59  Repeat thyroid ultrasound on September 10, 2023 Right lobe: 5.6 x 1.6 x 1.7 cm,  Left lobe: 4.8 x 1.3 x 1.8 cm   _________________________________________________________  previously seen cyst of the inferior right thyroid lobe has decreased in size due to loss of fluid component and now measures 0.5 cm compared to 1.0 cm on the prior exam. It does not meet criteria for FNA or imaging follow-up.   No new suspicious thyroid nodules are seen.   IMPRESSION: No significant sonographic abnormality of the thyroid.   The above is in keeping with the ACR TI-RADS recommendations - J Am Coll Radiol 2017;14:587-595.   Assessment & Plan:   Thyroid nodule I reviewed her new and existing medical records.  She was previously worked up and ruled out for pheochromocytoma.   -She returns with improved thyroid ultrasound profile.  She does have shrinking thyroid nodule which will not need fine-needle aspiration at this time.   She will not need immediate intervention.  She may need repeat thyroid ultrasound in 2 years.  Her thyroid function test are consistent with euthyroid presentation.  She would not need intervention with thyroid hormone at this time.   The patient was counseled on the dangers of tobacco use, and was advised to quit.  Reviewed strategies to maximize success, including removing cigarettes and smoking materials from environment.    -Patient is advised to maintain close  follow up with Elfredia Nevins, MD for primary care needs.  She can return to clinic as needed for any new abnormality on her endocrine profile.   I spent  22  minutes in the care of the patient today including review of labs from Thyroid Function, CMP, and other relevant labs ; imaging/biopsy records (current and previous including abstractions from other  facilities); face-to-face time discussing  her lab results and symptoms, medications doses, her options of short and long term treatment based on the latest standards of care / guidelines;   and documenting the encounter.  Brenda Gilbert  participated in the discussions, expressed understanding, and voiced agreement with the above plans.  All questions were answered to her satisfaction. she is encouraged to contact clinic should she have any questions or concerns prior to her return visit.    Follow up plan: Return if symptoms worsen or fail to improve.   Thank you for involving me in the care of this pleasant patient, and I will continue to update you with her progress.   Ronny Bacon, [place] [facility] [address] [place], [place] [address] Phone: [phone] Fax: [phone]  09/25/2023, 4:23 PM

## 2023-10-23 ENCOUNTER — Ambulatory Visit
Admission: EM | Admit: 2023-10-23 | Discharge: 2023-10-23 | Disposition: A | Discharge status: Home / Self Care | Payer: Medicaid Other | Attending: Family Medicine | Admitting: Family Medicine

## 2023-10-23 DIAGNOSIS — R110 Nausea: Secondary | ICD-10-CM | POA: Diagnosis not present

## 2023-10-23 DIAGNOSIS — S161XXA Strain of muscle, fascia and tendon at neck level, initial encounter: Secondary | ICD-10-CM

## 2023-10-23 DIAGNOSIS — S39012A Strain of muscle, fascia and tendon of lower back, initial encounter: Secondary | ICD-10-CM

## 2023-10-23 MED ORDER — TIZANIDINE HCL 4 MG PO CAPS: 4.0000 mg | ORAL_CAPSULE | Freq: Three times a day (TID) | ORAL | 0 refills | Status: AC | PRN

## 2023-10-23 MED ORDER — NAPROXEN 500 MG PO TABS: 500.0000 mg | ORAL_TABLET | Freq: Two times a day (BID) | ORAL | 0 refills | Status: DC | PRN

## 2023-10-23 NOTE — Discharge Instructions (Signed)
Utilize the anti-inflammatory pain medication and muscle relaxer as needed in addition to heat, massage, rest.  If symptoms significantly worsen at any time follow-up for recheck
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
## 2024-02-10 IMAGING — CT CT HEAD W/O CM
4 series · 16 of 47 positions shown, 18 images · non-contrast
Comparison: Brain MRI 03/16/2017.  Head CT 02/27/2017.

CLINICAL DATA: Transient ischemic attack (TIA). Additional history
provided: Left-sided numbness with elevated blood pressure for 3
weeks.



[Series 2: head w o · axial · 0.42mm/px · z∈[+151,+261]mm · 7 of 30 slices shown, 9 images]
[im 4/30  brain]
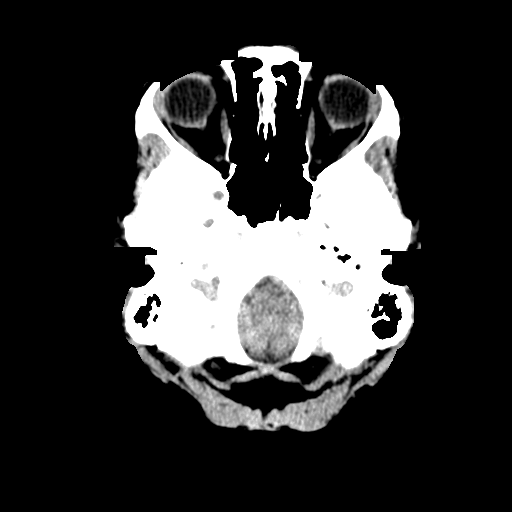
[im 4/30  bone]
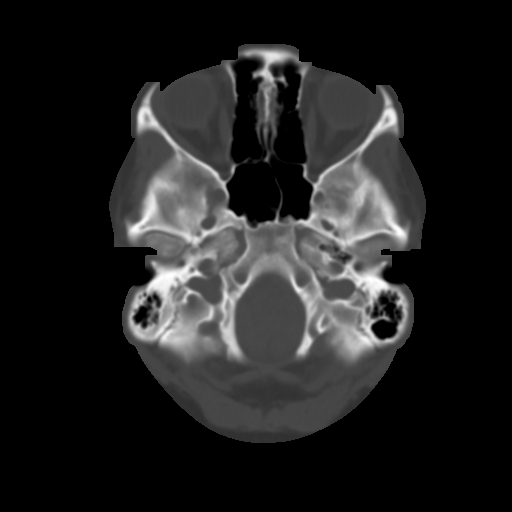
[im 8/30  brain]
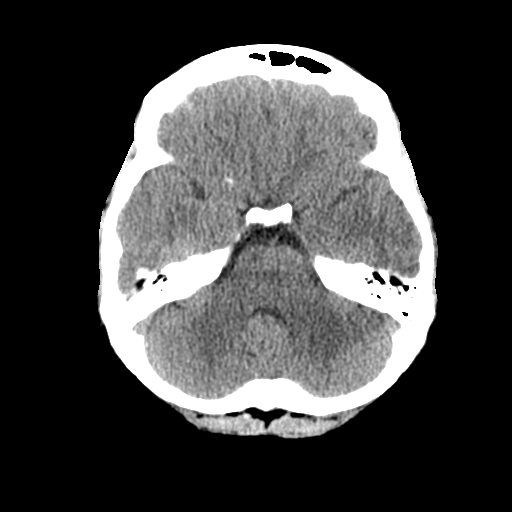
[im 11/30  brain]
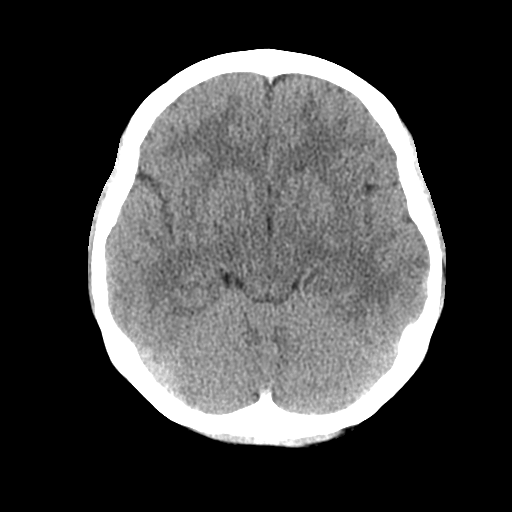
[im 15/30  brain]
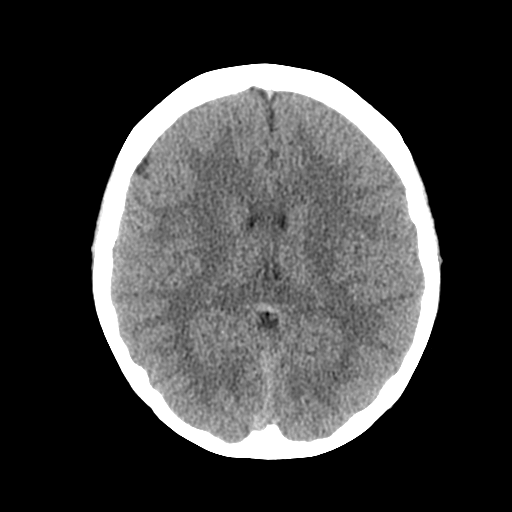
[im 19/30  brain]
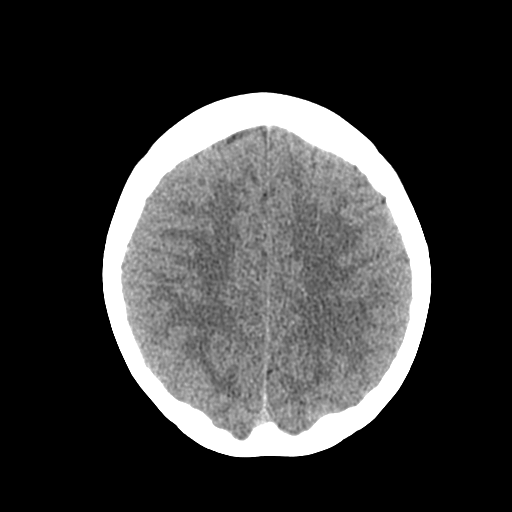
[im 19/30  bone]
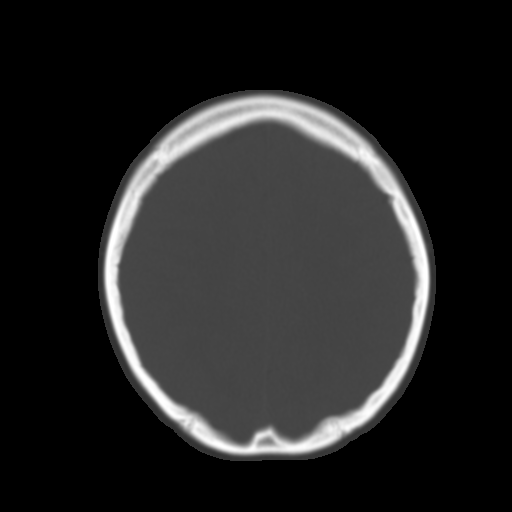
[im 22/30  brain]
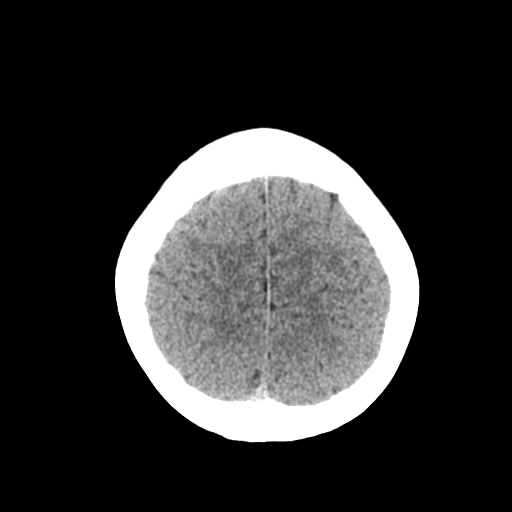
[im 26/30  brain]
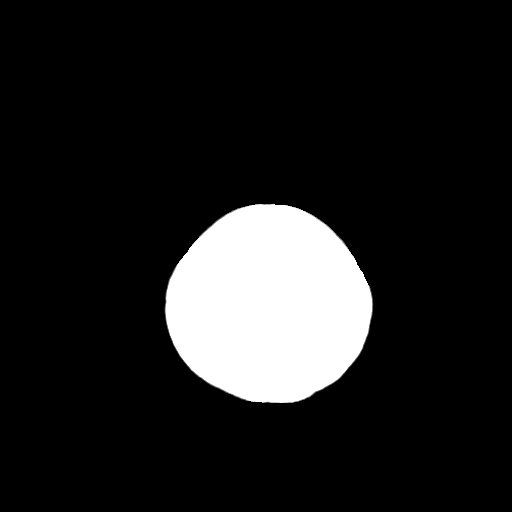

[Series 3: head bone · axial · 0.42mm/px · z∈[+150,+178]mm · 3 of 74 slices shown]
[im 8/74  bone]
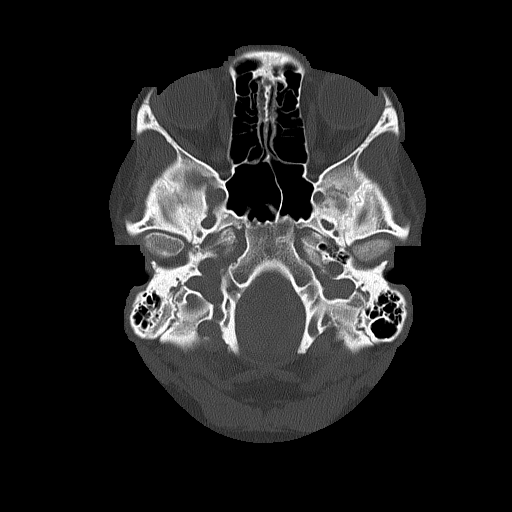
[im 15/74  bone]
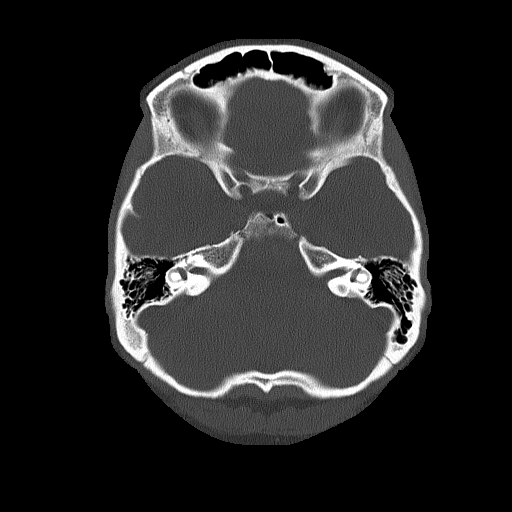
[im 22/74  bone]
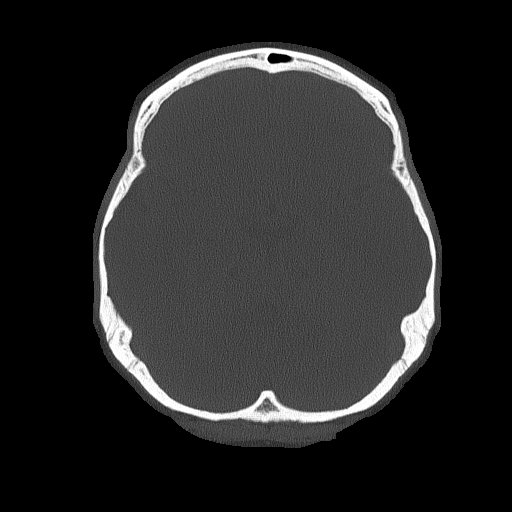

[Series 4: coronal soft · coronal · 0.31mm/px · 3 of 80 slices shown]
[im 27/80  brain]
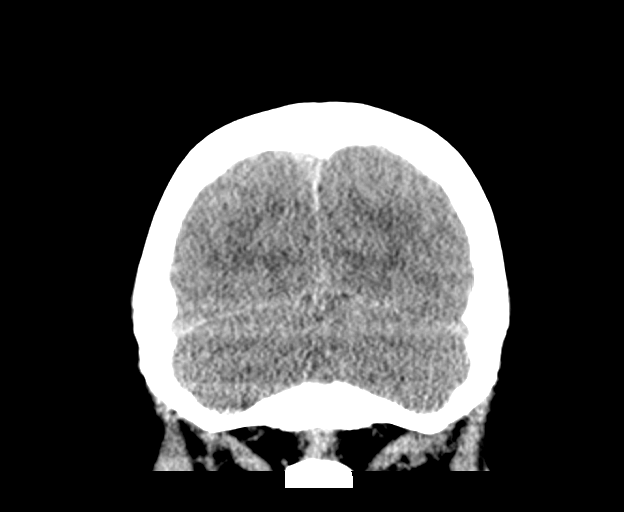
[im 36/80  brain]
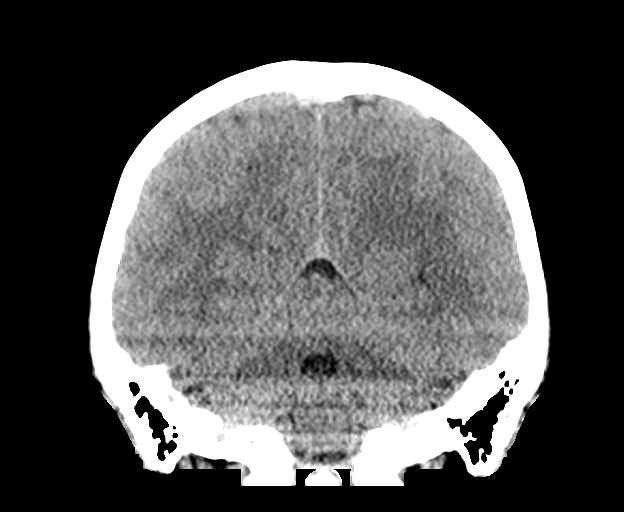
[im 44/80  brain]
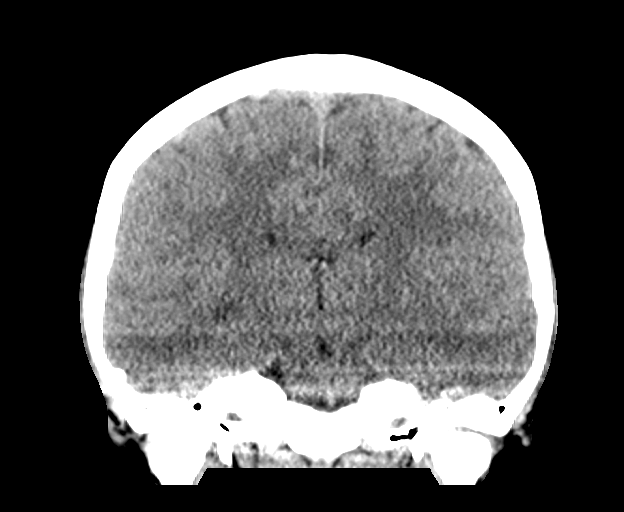

[Series 5: sagittal soft · sagittal · 0.34mm/px · 3 of 64 slices shown]
[im 22/64  brain]
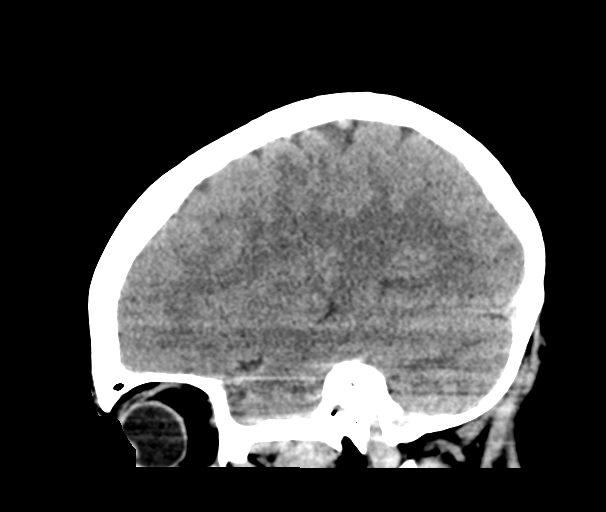
[im 32/64  brain]
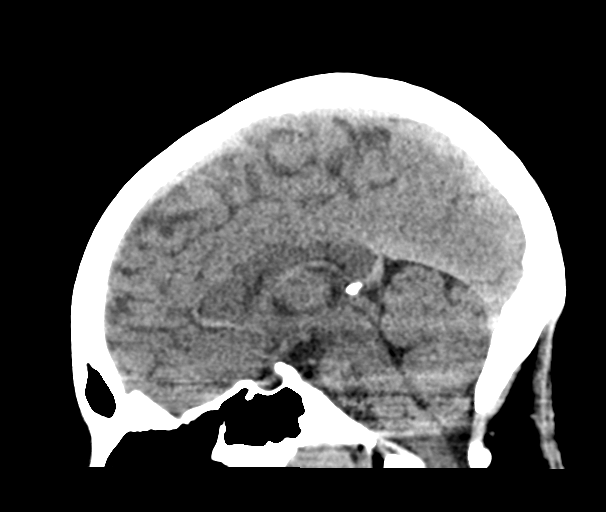
[im 43/64  brain]
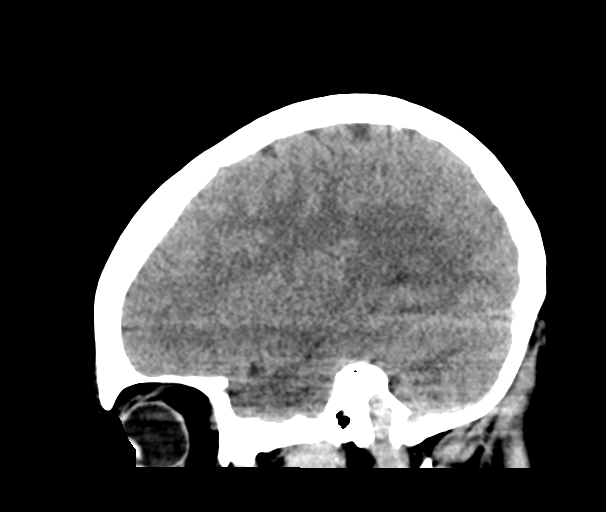

[16 of 47 positions shown; findings below may reference images not displayed]

FINDINGS: Brain:

Cerebral volume is normal.

There is no acute intracranial hemorrhage.

No demarcated cortical infarct.

No extra-axial fluid collection.

No evidence of an intracranial mass.

No midline shift.

Vascular: No hyperdense vessel.

Skull: No acute fracture or aggressive osseous lesion.

Sinuses/Orbits: No mass or acute finding within the imaged orbits.
No significant paranasal sinus disease at the imaged levels.
IMPRESSION: Unremarkable non-contrast CT appearance of the brain. No evidence of
acute intracranial abnormality.

## 2024-04-17 ENCOUNTER — Encounter: Payer: Self-pay | Admitting: *Deleted

## 2024-04-23 NOTE — Progress Notes (Signed)
 GI Office Note    Referring Provider: Jodi Munroe Primary Care Physician:  Jodi Munroe Primary Gastroenterologist: Windsor Hatcher.Rourk, MD  Date:  04/28/2024  ID:  Haylin Conley, DOB [birth year], MRN [id number]  Chief Complaint   Chief Complaint  Patient presents with   Abdominal Pain    Pt states she is having gastritis flare up.   History of Present Illness  Cela Denzer is a 23 y.o. female with a history of depression, H. pylori gastritis s/p eradication confirmed by EGD in 2019 presenting today with complaint of abdominal pain and change in stools.  Last EGD May 2019: (Full report not available, this information was obtained from pathology report) -Gastritis s/p biopsy  - Pathology with H. pylori  Last office visit 04/11/22.  Patient reported feeling food is getting stuck, usually just solids.-Going on for 2 months and been becoming more progressive.  Denied melena, hematochezia, hematemesis, or coffee-ground emesis.  Her associated symptoms include abdominal fullness, early satiety, and frequent hiccups.  Denied any shortness of breath or relationship to meals.  Reported pain in the LUQ.  Unable to discern significant aggravating factors.  Had been having gradual improvements in starting on pantoprazole twice daily.  Scheduled for EGD.  Advise continue PPI twice daily.  Advised over-the-counter famotidine  10-20 mg as needed for breakthrough.  GERD diet and lifestyle modifications reinforced.  EGD May 2023: - Normal esophagus s/p dilation and biopsy - Normal stomach - Normal duodenum - Esophageal biopsies benign, no increased eosinophils  Today:  Has not taken anything for reflux in quite some time. Not currently taking any prevacid. Symptoms worsened over the last 6 moths.  Having pain in the epigastric region and then referring to the left side. Reports a bloating sensation to the LUQ. It'll do this randomly. Feels like prevacid helped the best in the past.  Caffeine does not bother her except mt dew. No dysphagia.   Possibly had GI bug for 2 days a couple months ago.   Has been having more frequent and regular BM 1-2 times a da, in the past used to be constipated. Has been crampy and achy for the most part. Pain occurs randomly and sometimes daily but can be off and on. If she smells grease cooking then she gets nauseas. Has not had any vomiting episodes.   Wt Readings from Last 3 Encounters:  04/28/24 170 lb (77.1 kg)  09/25/23 145 lb 9.6 oz (66 kg)  09/12/22 180 lb 12.8 oz (82 kg)    Current Outpatient Medications  Medication Sig Dispense Refill   acetaminophen (TYLENOL) 325 MG tablet Take by mouth.     clonazePAM (KLONOPIN) 0.5 MG tablet Take 0.5 mg by mouth 2 (two) times daily as needed for anxiety.     EPINEPHrine  (EPIPEN  2-PAK) 0.3 mg/0.3 mL IJ SOAJ injection Inject 0.3 mLs (0.3 mg total) into the muscle as needed for anaphylaxis (for severe allergic reaction with difficulty breathing, tongue or lip swelling, chest pain or shortness of breath). 1 each 0   lansoprazole (PREVACID) 30 MG capsule Take 30 mg by mouth.     tiZANidine  (ZANAFLEX ) 4 MG capsule Take 1 capsule (4 mg total) by mouth 3 (three) times daily as needed for muscle spasms. Do not drink alcohol or drive while taking this medication.  May cause drowsiness. (Patient not taking: Reported on 04/28/2024) 15 capsule 0   VRAYLAR 1.5 MG capsule Take 1.5 mg by mouth. (Patient not taking: Reported on 04/28/2024)  No current facility-administered medications for this visit.    Past Medical History:  Diagnosis Date   Chronic abdominal pain    Fatigue    Helicobacter pylori (H. pylori) infection    History of kidney stones    Nausea and vomiting    recurrent    Past Surgical History:  Procedure Laterality Date   BIOPSY  05/03/2022   Procedure: BIOPSY;  Surgeon: Suzette Espy, MD;  [facility];  Service: Endoscopy;;   CESAREAN SECTION     03/28/2020 and 12/30/2021    ESOPHAGOGASTRODUODENOSCOPY (EGD) WITH PROPOFOL  N/A 05/03/2022   Rourk: normal esophagus s/p dilation and biopsy (benign), normal stomach, normal duodenum   MALONEY DILATION N/A 05/03/2022   Procedure: MALONEY DILATION;  Surgeon: Suzette Espy, MD;  [facility];  Service: Endoscopy;  Laterality: N/A;    Family History  Problem Relation Age of Onset   Cancer Mother    Thyroid  disease Mother    Hypertension Mother    Cancer Father    Diabetes Father    Hypertension Father    Barrett's esophagus Father    Esophageal cancer Father        40 years old   Colon polyps Maternal Grandmother    Cancer - Colon Neg Hx     Allergies as of 04/28/2024 - Review Complete 04/28/2024  Allergen Reaction Noted   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/2015    Social History   Socioeconomic History   Marital status: Married    Spouse name: Not on file   Number of children: Not on file   Years of education: Not on file   Highest education level: Not on file  Occupational History   Not on file  Tobacco Use   Smoking status: Every Day    Types: E-cigarettes   Smokeless tobacco: Never  Vaping Use   Vaping status: Every Day  Substance and Sexual Activity   Alcohol use: Yes    Comment: rare use   Drug use: No   Sexual activity: Yes    Birth control/protection: None  Other Topics Concern   Not on file  Social History Narrative   Not on file   Social Drivers of Health   Financial Resource Strain: Low Risk  (04/13/2024)   Received from [facility]   Overall Financial Resource Strain (CARDIA)    Difficulty of Paying Living Expenses: Not very hard  Food Insecurity: No Food Insecurity (04/13/2024)   Received from [facility]   Hunger Vital Sign    Worried About Running Out of Food in the Last Year: Never true    Ran Out of Food in the Last Year: Never true  Transportation Needs: No Transportation Needs (04/13/2024)   Received from  [facility] - Transportation    Lack of Transportation (Medical): No    Lack of Transportation (Non-Medical): No  Physical Activity: Insufficiently Active (04/13/2024)   Received from [facility]   Exercise Vital Sign    Days of Exercise per Week: 3 days    Minutes of Exercise per Session: 20 min  Stress: No Stress Concern Present (04/13/2024)   Received from [facility] - Occupational Stress Questionnaire    Feeling of Stress : Only a little  Social Connections: Moderately Integrated (04/13/2024)   Received from [facility]   Social Network    How would you rate your social network (family,  work, friends)?: Adequate participation with social networks     Review of Systems   Gen: Denies fever, chills, anorexia. Denies fatigue, weakness, weight loss.  CV: Denies chest pain, palpitations, syncope, peripheral edema, and claudication. Resp: Denies dyspnea at rest, cough, wheezing, coughing up blood, and pleurisy. GI: See HPI  Derm: Denies rash, itching, dry skin Psych: Denies depression, anxiety, memory loss, confusion. No homicidal or suicidal ideation.  Heme: Denies bruising, bleeding, and enlarged lymph nodes.  Physical Exam   BP 119/75   Pulse 98   Temp 98.6 F (37 C)   Ht 5\' 3"  (1.6 m)   Wt 170 lb (77.1 kg)   LMP 04/24/2024   BMI 30.11 kg/m   General:   Alert and oriented. No distress noted. Pleasant and cooperative.  Head:  Normocephalic and atraumatic. Eyes:  Conjuctiva clear without scleral icterus. Mouth:  Oral mucosa pink and moist. Good dentition. No lesions. Lungs:  Clear to auscultation bilaterally. No wheezes, rales, or rhonchi. No distress.  Heart:  S1, S2 present without murmurs appreciated.  Abdomen:  +BS, soft, non-tender and non-distended. No rebound or guarding. No HSM or masses noted. Rectal: deferred Msk:  Symmetrical without gross deformities. Normal posture. Extremities:  Without  edema. Neurologic:  Alert and  oriented x4 Psych:  Alert and cooperative. Normal mood and affect.  Assessment  Deari Frazer is a 23 y.o. female with a history of depression, H. pylori gastritis s/p eradication confirmed by EGD in 2019 presenting today with complaint of epigastric abdominal pain.   Epigastric/LUQ pain with distention, GERD: Last seen in April 2023 at which time she was having issues with dysphagia as well as pain in the left upper quadrant.  Had had relief with PPIs in the past.  Also early satiety in the past.  History of H. pylori on EGD in 2019.  EGD performed in May 2023 with normal esophagus, stomach, and duodenum and was given empiric dilation.  States Prevacid has been what has worked best for her in the past.  For the last 6 months she has had worsening epigastric along with left upper quadrant pain associated with some abdominal distention in this area.  Tries to avoid caffeine is much as possible.  Even the smell of greasy food recently has been causing her to have some nausea.  No issues with bowel habits at this time.  Currently also without any dysphagia symptoms.  Given history of H. pylori, will reassess at this time given it can recur.  Will treat empirically with famotidine  1-2 times daily for now.  Will resume PPI in the future if needed.  If H. pylori present we will treat  PLAN   H. Pylori breath test Famotidine  20 mg 1-2 times daily.  GERD Consider resumption of Prevacid if ongoing symptoms despite famotidine . Follow up in 6 weeks.    Julian Obey, MSN, FNP-BC, AGACNP-BC [facility]

## 2024-04-28 ENCOUNTER — Ambulatory Visit (INDEPENDENT_AMBULATORY_CARE_PROVIDER_SITE_OTHER): Admitting: Gastroenterology

## 2024-04-28 ENCOUNTER — Encounter: Payer: Self-pay | Admitting: Gastroenterology

## 2024-04-28 VITALS — BP 119/75 | HR 98 | Temp 98.6°F | Ht 63.0 in | Wt 170.0 lb

## 2024-04-28 DIAGNOSIS — R1013 Epigastric pain: Secondary | ICD-10-CM

## 2024-04-28 DIAGNOSIS — R140 Abdominal distension (gaseous): Secondary | ICD-10-CM

## 2024-04-28 DIAGNOSIS — R1012 Left upper quadrant pain: Secondary | ICD-10-CM

## 2024-04-28 DIAGNOSIS — G8929 Other chronic pain: Secondary | ICD-10-CM

## 2024-04-28 DIAGNOSIS — K219 Gastro-esophageal reflux disease without esophagitis: Secondary | ICD-10-CM

## 2024-04-28 DIAGNOSIS — Z8619 Personal history of other infectious and parasitic diseases: Secondary | ICD-10-CM

## 2024-04-28 NOTE — Patient Instructions (Addendum)
 We will reassess for H. Pylori given your symptoms and history.  You may go anytime to have the breath test performed just please make sure you have not had anything to eat or drink within at least 2 hours before, most accurate results occur with you doing it first thing in the morning after not eating or drinking anything after midnight.  Is also important that you do not smoke prior to breath test.   2 locations for [facility] in [place]:              1. [address], [place]              2. [address] [place]   I would like for you to start taking famotidine  20 mg 1-2 times a day.  Preferably take at least 30 minutes prior to meal.  Start out with once a day before breakfast and if still having symptoms after 1 week, you may increase to twice daily and take your second dose 30 minutes prior to dinner.  Follow a GERD diet:  Avoid fried, fatty, greasy, spicy, citrus foods. Avoid caffeine and carbonated beverages. Avoid chocolate. Try eating 4-6 small meals a day rather than 3 large meals. Do not eat within 3 hours of laying down. Prop head of bed up on wood or bricks to create a 6 inch incline.  We will follow-up in 6 weeks, sooner if needed.  If you are not having improvement with famotidine  after 3-4 weeks then please let me know.  It was a pleasure to see you today. I want to create trusting relationships with patients. If you receive a survey regarding your visit,  I greatly appreciate you taking time to fill this out on paper or through your MyChart. I value your feedback.  Julian Obey, MSN, FNP-BC, AGACNP-BC [facility]

## 2024-05-02 LAB — H. PYLORI BREATH TEST: H pylori Breath Test: NEGATIVE

## 2024-05-06 ENCOUNTER — Ambulatory Visit: Payer: Self-pay | Admitting: *Deleted

## 2024-06-16 ENCOUNTER — Ambulatory Visit: Admitting: Gastroenterology

## 2024-06-17 ENCOUNTER — Encounter: Payer: Self-pay | Admitting: Gastroenterology

## 2024-12-29 ENCOUNTER — Ambulatory Visit
Admission: EM | Admit: 2024-12-29 | Discharge: 2024-12-29 | Disposition: A | Discharge status: Home / Self Care | Attending: Family Medicine | Admitting: Family Medicine

## 2024-12-29 DIAGNOSIS — R062 Wheezing: Secondary | ICD-10-CM | POA: Diagnosis not present

## 2024-12-29 DIAGNOSIS — J208 Acute bronchitis due to other specified organisms: Secondary | ICD-10-CM

## 2024-12-29 MED ORDER — AZELASTINE HCL 0.1 % NA SOLN: 1.0000 | Freq: Two times a day (BID) | NASAL | 0 refills | Status: AC

## 2024-12-29 MED ORDER — PROMETHAZINE-DM 6.25-15 MG/5ML PO SYRP: 5.0000 mL | ORAL_SOLUTION | Freq: Four times a day (QID) | ORAL | 0 refills | Status: AC | PRN

## 2024-12-29 MED ORDER — PREDNISONE 20 MG PO TABS: 40.0000 mg | ORAL_TABLET | Freq: Every day | ORAL | 0 refills | Status: AC

## 2024-12-29 MED ORDER — ALBUTEROL SULFATE HFA 108 (90 BASE) MCG/ACT IN AERS: 2.0000 | INHALATION_SPRAY | RESPIRATORY_TRACT | 0 refills | Status: AC | PRN

## 2024-12-29 NOTE — Discharge Instructions (Signed)
 In addition to the prescribed medications you may take over-the-counter cold and congestion medications as needed, use saline sinus rinses, humidifiers, drink plenty of fluids and get lots of rest.  Follow-up for worsening or unresolving symptoms

## 2024-12-29 NOTE — ED Provider Notes (Signed)
 " [facility]    CSN: [id number] Arrival date & time: 12/29/24  0855      History   Chief Complaint Chief Complaint  Patient presents with   Cough    HPI Brenda Gilbert is a 24 y.o. female.   Presenting today with 4-day history of cough, wheezing, congestion, hoarseness, sinus pressure, fatigue.  Denies fever, chest pain, shortness of breath, abdominal pain, diarrhea.  So far initially was trying Sudafed and other over-the-counter remedies with minimal relief.  No known history of pertinent chronic medical problems per patient.    Past Medical History:  Diagnosis Date   Chronic abdominal pain    Fatigue    Helicobacter pylori (H. pylori) infection    History of kidney stones    Nausea and vomiting    recurrent    Patient Active Problem List   Diagnosis Date Noted   Endocrine disorder, unspecified 08/24/2022   Current smoker 08/24/2022    Past Surgical History:  Procedure Laterality Date   BIOPSY  05/03/2022   Procedure: BIOPSY;  Surgeon: Shaaron Lamar HERO, MD;  [facility];  Service: Endoscopy;;   CESAREAN SECTION     03/28/2020 and 12/30/2021   ESOPHAGOGASTRODUODENOSCOPY (EGD) WITH PROPOFOL  N/A 05/03/2022   Rourk: normal esophagus s/p dilation and biopsy (benign), normal stomach, normal duodenum   MALONEY DILATION N/A 05/03/2022   Procedure: MALONEY DILATION;  Surgeon: Shaaron Lamar HERO, MD;  [facility];  Service: Endoscopy;  Laterality: N/A;    OB History     Gravida      Para      Term      Preterm      AB      Living  0      SAB      IAB      Ectopic      Multiple      Live Births               Home Medications    Prior to Admission medications  Medication Sig Start Date End Date Taking? Authorizing Provider  albuterol  (VENTOLIN  HFA) 108 (90 Base) MCG/ACT inhaler Inhale 2 puffs into the lungs every 4 (four) hours as needed. 12/29/24  Yes Stuart Vernell Norris, PA-C  azelastine  (ASTELIN ) 0.1 % nasal  spray Place 1 spray into both nostrils 2 (two) times daily. Use in each nostril as directed 12/29/24  Yes Stuart Vernell Norris, PA-C  predniSONE  (DELTASONE ) 20 MG tablet Take 2 tablets (40 mg total) by mouth daily with breakfast. 12/29/24  Yes Stuart Vernell Norris, PA-C  promethazine -dextromethorphan (PROMETHAZINE -DM) 6.25-15 MG/5ML syrup Take 5 mLs by mouth 4 (four) times daily as needed. 12/29/24  Yes Stuart Vernell Norris, PA-C  acetaminophen (TYLENOL) 325 MG tablet Take by mouth. 12/26/17   [provider]  clonazePAM (KLONOPIN) 0.5 MG tablet Take 0.5 mg by mouth 2 (two) times daily as needed for anxiety.    [provider]  EPINEPHrine  (EPIPEN  2-PAK) 0.3 mg/0.3 mL IJ SOAJ injection Inject 0.3 mLs (0.3 mg total) into the muscle as needed for anaphylaxis (for severe allergic reaction with difficulty breathing, tongue or lip swelling, chest pain or shortness of breath). 05/21/20   Rancour, Garnette, MD  lansoprazole (PREVACID) 30 MG capsule Take 30 mg by mouth. 03/28/16   [provider]  tiZANidine  (ZANAFLEX ) 4 MG capsule Take 1 capsule (4 mg total) by mouth 3 (three) times daily as needed for muscle spasms. Do not drink alcohol  or drive while taking this medication.  May cause drowsiness. Patient not taking: Reported on 04/28/2024 10/23/23   Stuart Vernell Norris, PA-C  VRAYLAR 1.5 MG capsule Take 1.5 mg by mouth. Patient not taking: Reported on 04/28/2024 09/17/23   [provider]    Family History Family History  Problem Relation Age of Onset   Cancer Mother    Thyroid  disease Mother    Hypertension Mother    Cancer Father    Diabetes Father    Hypertension Father    Barrett's esophagus Father    Esophageal cancer Father        11 years old   Colon polyps Maternal Grandmother    Cancer - Colon Neg Hx     Social History Social History[1]   Allergies   Fish oil and Fish protein-containing drug products   Review of Systems Review of Systems PER  HPI  Physical Exam Triage Vital Signs ED Triage Vitals  Encounter Vitals Group     BP 12/29/24 1003 121/85     Girls Systolic BP Percentile --      Girls Diastolic BP Percentile --      Boys Systolic BP Percentile --      Boys Diastolic BP Percentile --      Pulse Rate 12/29/24 1003 88     Resp 12/29/24 1003 16     Temp 12/29/24 1003 97.9 F (36.6 C)     Temp Source 12/29/24 1003 Oral     SpO2 12/29/24 1003 98 %     Weight --      Height --      Head Circumference --      Peak Flow --      Pain Score 12/29/24 1005 4     Pain Loc --      Pain Education --      Exclude from Growth Chart --    No data found.  Updated Vital Signs BP 121/85 (BP Location: Right Arm)   Pulse 88   Temp 97.9 F (36.6 C) (Oral)   Resp 16   LMP 12/07/2024 (Exact Date)   SpO2 98%   Visual Acuity Right Eye Distance:   Left Eye Distance:   Bilateral Distance:    Right Eye Near:   Left Eye Near:    Bilateral Near:     Physical Exam Vitals and nursing note reviewed.  Constitutional:      Appearance: Normal appearance.  HENT:     Head: Atraumatic.     Right Ear: Tympanic membrane and external ear normal.     Left Ear: Tympanic membrane and external ear normal.     Nose: Rhinorrhea present.     Mouth/Throat:     Mouth: Mucous membranes are moist.     Pharynx: Posterior oropharyngeal erythema present.  Eyes:     Extraocular Movements: Extraocular movements intact.     Conjunctiva/sclera: Conjunctivae normal.  Cardiovascular:     Rate and Rhythm: Normal rate and regular rhythm.     Heart sounds: Normal heart sounds.  Pulmonary:     Effort: Pulmonary effort is normal.     Breath sounds: Wheezing present. No rales.  Musculoskeletal:        General: Normal range of motion.     Cervical back: Normal range of motion and neck supple.  Skin:    General: Skin is warm and dry.  Neurological:     Mental Status: She is alert and oriented to person, place, and  time.  Psychiatric:         Mood and Affect: Mood normal.        Thought Content: Thought content normal.      UC Treatments / Results  Labs (all labs ordered are listed, but only abnormal results are displayed) Labs Reviewed - No data to display  EKG   Radiology No results found.  Procedures Procedures (including critical care time)  Medications Ordered in UC Medications - No data to display  Initial Impression / Assessment and Plan / UC Course  I have reviewed the triage vital signs and the nursing notes.  Pertinent labs & imaging results that were available during my care of the patient were reviewed by me and considered in my medical decision making (see chart for details).     Vitals and exam overall reassuring, suspect viral bronchitis.  She does have some wheezes on exam so we will treat with prednisone , albuterol  inhaler as needed, Astelin , Phenergan  DM, supportive over-the-counter medications and home care.  Return for worsening or unresolving symptoms.  Final Clinical Impressions(s) / UC Diagnoses   Final diagnoses:  Viral bronchitis  Wheezing     Discharge Instructions      In addition to the prescribed medications you may take over-the-counter cold and congestion medications as needed, use saline sinus rinses, humidifiers, drink plenty of fluids and get lots of rest.  Follow-up for worsening or unresolving symptoms    ED Prescriptions     Medication Sig Dispense Auth. Provider   predniSONE  (DELTASONE ) 20 MG tablet Take 2 tablets (40 mg total) by mouth daily with breakfast. 10 tablet Stuart Vernell Norris, PA-C   azelastine  (ASTELIN ) 0.1 % nasal spray Place 1 spray into both nostrils 2 (two) times daily. Use in each nostril as directed 30 mL Stuart Vernell Norris, PA-C   albuterol  (VENTOLIN  HFA) 108 (90 Base) MCG/ACT inhaler Inhale 2 puffs into the lungs every 4 (four) hours as needed. 18 g Stuart Vernell Norris, PA-C   promethazine -dextromethorphan (PROMETHAZINE -DM) 6.25-15  MG/5ML syrup Take 5 mLs by mouth 4 (four) times daily as needed. 100 mL Stuart Vernell Norris, [place]      PDMP not reviewed this encounter.    [1]  Social History Tobacco Use   Smoking status: Every Day    Types: E-cigarettes   Smokeless tobacco: Never  Vaping Use   Vaping status: Every Day  Substance Use Topics   Alcohol use: Yes    Comment: rare use   Drug use: No     Stuart Vernell Norris, PA-C 12/29/24 1043  "

## 2024-12-29 NOTE — ED Triage Notes (Signed)
 Cough, congestion,  sinus pressure x 4 days, Taking sudafed.
